# Patient Record
Sex: Female | Born: 1995 | Race: White | Hispanic: No | Smoking: Current every day smoker
Health system: Southern US, Community
[De-identification: ages and names within clinical notes are randomized; demographics above are authoritative.]

## PROBLEM LIST (undated history)

## (undated) ENCOUNTER — Inpatient Hospital Stay: Payer: Self-pay

## (undated) DIAGNOSIS — Z789 Other specified health status: Secondary | ICD-10-CM

## (undated) DIAGNOSIS — Z9119 Patient's noncompliance with other medical treatment and regimen: Secondary | ICD-10-CM

## (undated) DIAGNOSIS — O2340 Unspecified infection of urinary tract in pregnancy, unspecified trimester: Secondary | ICD-10-CM

## (undated) DIAGNOSIS — K5900 Constipation, unspecified: Secondary | ICD-10-CM

## (undated) DIAGNOSIS — F1290 Cannabis use, unspecified, uncomplicated: Secondary | ICD-10-CM

## (undated) DIAGNOSIS — Z91199 Patient's noncompliance with other medical treatment and regimen due to unspecified reason: Secondary | ICD-10-CM

## (undated) DIAGNOSIS — O99619 Diseases of the digestive system complicating pregnancy, unspecified trimester: Secondary | ICD-10-CM

## (undated) DIAGNOSIS — D649 Anemia, unspecified: Secondary | ICD-10-CM

## (undated) DIAGNOSIS — O0930 Supervision of pregnancy with insufficient antenatal care, unspecified trimester: Secondary | ICD-10-CM

## (undated) DIAGNOSIS — F1910 Other psychoactive substance abuse, uncomplicated: Secondary | ICD-10-CM

## (undated) HISTORY — PX: NO PAST SURGERIES: SHX2092

---
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
## 2017-04-28 ENCOUNTER — Inpatient Hospital Stay
Admission: EM | Admit: 2017-04-28 | Discharge: 2017-04-28 | Disposition: A | Discharge status: Home / Self Care | Payer: Medicaid Other | Attending: Obstetrics and Gynecology | Admitting: Obstetrics and Gynecology

## 2017-04-28 ENCOUNTER — Encounter: Payer: Self-pay | Admitting: Obstetrics and Gynecology

## 2017-04-28 DIAGNOSIS — O26899 Other specified pregnancy related conditions, unspecified trimester: Secondary | ICD-10-CM

## 2017-04-28 DIAGNOSIS — R109 Unspecified abdominal pain: Principal | ICD-10-CM

## 2017-04-28 MED ORDER — CALCIUM CARBONATE ANTACID 500 MG PO CHEW: 2.0000 | CHEWABLE_TABLET | ORAL | Status: DC | PRN

## 2017-04-28 MED ORDER — ZOLPIDEM TARTRATE 5 MG PO TABS: 5.0000 mg | ORAL_TABLET | Freq: Every evening | ORAL | Status: DC | PRN

## 2017-04-28 MED ORDER — ACETAMINOPHEN 325 MG PO TABS: 650.0000 mg | ORAL_TABLET | ORAL | Status: DC | PRN

## 2017-04-28 MED ORDER — PRENATAL MULTIVITAMIN CH: 1.0000 | ORAL_TABLET | Freq: Every day | ORAL | Status: DC

## 2017-04-28 MED ORDER — DOCUSATE SODIUM 100 MG PO CAPS: 100.0000 mg | ORAL_CAPSULE | Freq: Every day | ORAL | Status: DC

## 2017-04-28 NOTE — OB Triage Provider Note (Signed)
TRIAGE VISIT with NST   Tamara [name] is a 21 y.o. G1P0. She is at Unknown gestation, but by LMP of approximately 09/17/16 she is at 8335w6d.  Indication: abdominal pain.   S: No CTX, no VB. Active fetal movement. No nausea, vomiting, fever. She entered care at 24wks and has had 1 prenatal visit.  O:  Ht 4\' 11"  (1.499 m)   Wt 48.5 kg (107 lb)   BMI 21.61 kg/m  No results found for this or any previous visit (from the past 48 hour(s)).   Gen: NAD, AAOx3      Abd: FNTTP      Ext: Non-tender, Nonedmeatous    FHT: 140, mod var, +accels, decels TOCO: quiet SVE:  deferred   A/P:  21 y.o. G1P0 at approx 8535w6d with abdominal pain   Limited prenatal care  - will draw prenatal labs, get u/s, check for liver, urinary tract infection, monitoring, UDS, ultrasound and will check cervix  Fetal Wellbeing: NST is Reassuring Cat 1 tracing.

## 2017-04-28 NOTE — Progress Notes (Signed)
Went into patient's room to readjust ultrasound monitor. Patient was not in the room or the bathroom and the gown and monitors were left on the bed. Patient appear to have left AMA. Patient did not let anyone know that she was leaving. Dr. Dalbert GarnetBeasley, MD notified and aware.

## 2017-04-28 NOTE — Progress Notes (Signed)
  Discharge summary  Pt left AMA without alerting any of the staff. She did not receive an ultrasound or labs.

## 2017-04-28 NOTE — OB Triage Note (Signed)
Pt presents to [facility] with intermittent sharp, lower abdominal pain and tightness that began last night. Pt denies sharp pain at this time, but states that her lower abdomen is tight and rates her pain 7/10. Pt reports positive fetal movement. Denies vaginal bleeding and LOF. VSS. Monitors applied and assessing.

## 2017-06-28 ENCOUNTER — Other Ambulatory Visit: Payer: Self-pay

## 2017-06-28 ENCOUNTER — Inpatient Hospital Stay
Admission: EM | Admit: 2017-06-28 | Discharge: 2017-06-29 | DRG: 833 | Disposition: A | Discharge status: Home / Self Care | Payer: Medicaid Other | Attending: Obstetrics and Gynecology | Admitting: Obstetrics and Gynecology

## 2017-06-28 ENCOUNTER — Inpatient Hospital Stay: Payer: Medicaid Other

## 2017-06-28 DIAGNOSIS — O99013 Anemia complicating pregnancy, third trimester: Secondary | ICD-10-CM | POA: Diagnosis present

## 2017-06-28 DIAGNOSIS — F1290 Cannabis use, unspecified, uncomplicated: Secondary | ICD-10-CM | POA: Diagnosis present

## 2017-06-28 DIAGNOSIS — O99323 Drug use complicating pregnancy, third trimester: Secondary | ICD-10-CM | POA: Diagnosis present

## 2017-06-28 DIAGNOSIS — O99613 Diseases of the digestive system complicating pregnancy, third trimester: Secondary | ICD-10-CM | POA: Diagnosis present

## 2017-06-28 DIAGNOSIS — R1030 Lower abdominal pain, unspecified: Secondary | ICD-10-CM | POA: Diagnosis present

## 2017-06-28 DIAGNOSIS — Z3A40 40 weeks gestation of pregnancy: Secondary | ICD-10-CM | POA: Diagnosis not present

## 2017-06-28 DIAGNOSIS — D649 Anemia, unspecified: Secondary | ICD-10-CM | POA: Diagnosis present

## 2017-06-28 DIAGNOSIS — F17210 Nicotine dependence, cigarettes, uncomplicated: Secondary | ICD-10-CM | POA: Diagnosis present

## 2017-06-28 DIAGNOSIS — O0933 Supervision of pregnancy with insufficient antenatal care, third trimester: Secondary | ICD-10-CM

## 2017-06-28 DIAGNOSIS — K5909 Other constipation: Secondary | ICD-10-CM | POA: Diagnosis present

## 2017-06-28 DIAGNOSIS — O99333 Smoking (tobacco) complicating pregnancy, third trimester: Secondary | ICD-10-CM | POA: Diagnosis present

## 2017-06-28 HISTORY — DX: Other specified health status: Z78.9

## 2017-06-28 HISTORY — DX: Supervision of pregnancy with insufficient antenatal care, third trimester: O09.33

## 2017-06-28 MED ORDER — ONDANSETRON HCL 4 MG/2ML IJ SOLN: 4.0000 mg | Freq: Four times a day (QID) | INTRAMUSCULAR | Status: DC | PRN

## 2017-06-28 MED ORDER — ACETAMINOPHEN 325 MG PO TABS: 650.0000 mg | ORAL_TABLET | ORAL | Status: DC | PRN

## 2017-06-28 MED ORDER — OXYTOCIN 40 UNITS IN LACTATED RINGERS INFUSION - SIMPLE MED: 2.5000 [IU]/h | INTRAVENOUS | Status: DC

## 2017-06-28 MED ORDER — SOD CITRATE-CITRIC ACID 500-334 MG/5ML PO SOLN: 30.0000 mL | ORAL | Status: DC | PRN

## 2017-06-28 MED ORDER — LIDOCAINE HCL (PF) 1 % IJ SOLN: 30.0000 mL | INTRAMUSCULAR | Status: DC | PRN

## 2017-06-28 MED ORDER — OXYTOCIN BOLUS FROM INFUSION: 500.0000 mL | Freq: Once | INTRAVENOUS | Status: DC

## 2017-06-28 MED ORDER — LACTATED RINGERS IV SOLN: 500.0000 mL | INTRAVENOUS | Status: DC | PRN

## 2017-06-28 MED ORDER — LACTATED RINGERS IV SOLN
INTRAVENOUS | Status: DC
  Administered 2017-06-29 (×2): via INTRAVENOUS

## 2017-06-28 NOTE — OB Triage Note (Signed)
Pt arrived in triage with c/o of lower abdominal "cramping" since yesterday rating pain 7/10.  Denies leaking of fluid or vaginal bleeding. Reports good fetal movement. Fetal movement palpated and audible on monitor.  EFM explained and applied. Discussed plan of care. Pt verbalized understanding.

## 2017-06-28 NOTE — Progress Notes (Signed)
Tamara Tamara [name] is Tamara [name] 22 y.o. G1P0 female. She is at 5245w4d gestation. No LMP recorded. Patient is pregnant. Estimated Date of Delivery: 06/24/17  Prenatal care site:  [facility]   Current pregnancy complicated by:  1. Limited/late prenatal care, 1 visit to [facility] at 24wks per LMP. 2. Current tobacco smoker, 1ppd 3. Hx illicit drug use, currently only MJ.   Chief complaint: abdominal pain   Location: lower abdomen Duration: cramping since yesterday.  Quality: comes and goes Severity: 7/10 Aggravating or alleviating conditions: Had red vaginal bleeding in last 6 weeks, thick "looked like ketchup"  Associated signs/symptoms: denies LOF, feeling irregular cramping.  Context:  S: Resting comfortably. no CTX, no VB.no LOF,  Active fetal movement. Denies: HA, visual changes, SOB, or RUQ/epigastric pain.   Maternal Medical History:   Past Medical History:  Diagnosis Date  . Medical history non-contributory   Pt denies medical or surgical hx.   Past Surgical History:  Procedure Laterality Date  . NO PAST SURGERIES      Not on File  Prior to Admission medications   Medication Sig Start Date End Date Taking? Authorizing Provider  Prenatal Vit-Fe Fumarate-FA (PRENATAL MULTIVITAMIN) TABS tablet Take 1 tablet by mouth daily at 12 noon.   Yes [provider]      Social History: She  reports that she has been smoking cigarettes.  She has Tamara [name] 5.00 pack-year smoking history. she has never used smokeless tobacco. She reports that she uses drugs. Drug: Marijuana. She reports that she does not drink alcohol. Social hx of cocaine, MJ, ETOH, acid and xanax at age 22 per [facility] records.   Family History: family history is not on file.  Review of Systems: Tamara [name] full review of systems was performed and negative except as noted in the HPI.     O:  BP 114/68   Pulse (!) 101   Temp 98 F (36.7 C) (Oral)   Resp 18   Ht 4\' 11"  (1.499 m)   Wt 132 lb (59.9 kg)   BMI 26.66 kg/m  No results found  for this or any previous visit (from the past 48 hour(s)).   Constitutional: NAD, AAOx3  HE/ENT: extraocular movements grossly intact, moist mucous membranes CV: RRR PULM: nl respiratory effort, CTABL     Abd: gravid, non-tender, non-distended, soft      Ext: Non-tender, Nonedematous   Psych: mood appropriate, speech normal Pelvic: deferred until after US regarding placental location.   Fetal  monitoring: Cat 1 Appropriate for GA Baseline:  Variability: moderate Accelerations: present  Decelerations absent    Tamara [name]/P: 22 y.o. 1445w4d here for triage visit for abdominal pain in pregnancy   Fetal Wellbeing: Reassuring Cat 1 tracing.  No prenatal care and 11045w4d by LMP- will obtain US and prenatal labs now.   Discussed with Dr Feliberto GottronSchermerhorn regarding POC. Plan to admit.    Tamara Tamara [name], CNM 06/28/17

## 2017-06-28 NOTE — H&P (Signed)
OB History & Physical   History of Present Illness:  Chief Complaint: presented with C/o abdominal pain.   HPI:  Tamara [name] is a 22 y.o. G1P0 female at 4672w4d dated by approximate LMP.   Active FM onset of ctx 06/27/17, currently every 3-6 minutes with uterine irritability Denies LOF, recent VB (last approx 6wks ago). Pelvic pressure intermittently.     Pregnancy Issues: 1. Limited/late prenatal care, 1 visit to [facility] at 24wks per LMP. 2. Current tobacco smoker, 1ppd 3. Hx illicit drug use, currently only MJ.    Maternal Medical History:   Past Medical History:  Diagnosis Date  . Medical history non-contributory   Pt reports chronic constipation, "never been able to have normal BM"   Past Surgical History:  Procedure Laterality Date  . NO PAST SURGERIES      Not on File  Prior to Admission medications   Medication Sig Start Date End Date Taking? Authorizing Provider  Prenatal Vit-Fe Fumarate-FA (PRENATAL MULTIVITAMIN) TABS tablet Take 1 tablet by mouth daily at 12 noon.   Yes [provider]     [facility] (only one visit, approx 24wks)  Social History: She  reports that she has been smoking cigarettes.  She has a 5.00 pack-year smoking history. she has never used smokeless tobacco. She reports that she uses drugs. Drug: Marijuana. She reports that she does not drink alcohol. Social hx of cocaine, MJ, ETOH, acid and xanax at age 22 per [facility] records.    Family History: family history is not on file.   Review of Systems: A full review of systems was performed and negative except as noted in the HPI.     Physical Exam:  Vital Signs: BP 114/68   Pulse (!) 101   Temp 98 F (36.7 C) (Oral)   Resp 18   Ht 4\' 11"  (1.499 m)   Wt 132 lb (59.9 kg)   BMI 26.66 kg/m  General: no acute distress.  HEENT: normocephalic, atraumatic Heart: regular rate & rhythm.  No murmurs/rubs/gallops Lungs: clear to auscultation  bilaterally, normal respiratory effort Abdomen: soft, gravid, non-tender;  EFW: 8lbs by leopolds Pelvic:   External: Normal external female genitalia with significant erythema, white vaginal discharge.  Unable to perform speculum or vaginal exam without significant pt discomfort, extremely large stool burden noted in rectum.    Cervix:   /   /   unable to assess, closed per US images.    Extremities: non-tender, symmetric, no edema bilaterally.  DTRs: 2+  Neurologic: Alert & oriented x 3.    Results for orders placed or performed during the hospital encounter of 06/28/17 (from the past 24 hour(s))  CBC     Status: Abnormal   Collection Time: 06/28/17 11:39 PM  Result Value Ref Range   WBC 11.8 (H) 3.6 - 11.0 K/uL   RBC 3.34 (L) 3.80 - 5.20 MIL/uL   Hemoglobin 8.5 (L) 12.0 - 16.0 g/dL   HCT 11.925.9 (L) 14.735.0 - 82.947.0 %   MCV 77.5 (L) 80.0 - 100.0 fL   MCH 25.5 (L) 26.0 - 34.0 pg   MCHC 32.9 32.0 - 36.0 g/dL   RDW 56.215.7 (H) 13.011.5 - 86.514.5 %   Platelets 424 150 - 440 K/uL  Type and screen [facility]     Status: None (Preliminary result)   Collection Time: 06/28/17 11:39 PM  Result Value Ref Range   ABO/RH(D) PENDING    Antibody Screen  PENDING    Sample Expiration      07/01/2017 Performed at [facility], [address] [place], [place] [address]   Comprehensive metabolic panel     Status: Abnormal   Collection Time: 06/28/17 11:39 PM  Result Value Ref Range   Sodium 134 (L) 135 - 145 mmol/L   Potassium 3.6 3.5 - 5.1 mmol/L   Chloride 104 101 - 111 mmol/L   CO2 19 (L) 22 - 32 mmol/L   Glucose, Bld 93 65 - 99 mg/dL   BUN 10 6 - 20 mg/dL   Creatinine, Ser 8.11 0.44 - 1.00 mg/dL   Calcium 8.8 (L) 8.9 - 10.3 mg/dL   Total Protein 6.7 6.5 - 8.1 g/dL   Albumin 2.8 (L) 3.5 - 5.0 g/dL   AST 17 15 - 41 U/L   ALT 11 (L) 14 - 54 U/L   Alkaline Phosphatase 140 (H) 38 - 126 U/L   Total Bilirubin 0.7 0.3 - 1.2 mg/dL   GFR calc non Af Amer >60 >60 mL/min   GFR  calc Af Amer >60 >60 mL/min   Anion gap 11 5 - 15  Rapid HIV screen (HIV 1/2 Ab+Ag) ([facility] Only)     Status: None   Collection Time: 06/28/17 11:39 PM  Result Value Ref Range   HIV-1 P24 Antigen - HIV24 NON REACTIVE NON REACTIVE   HIV 1/2 Antibodies NON REACTIVE NON REACTIVE   Interpretation (HIV Ag Ab)      A non reactive test result means that HIV 1 or HIV 2 antibodies and HIV 1 p24 antigen were not detected in the specimen.    Pertinent Results:  Prenatal Labs: No prenatal labs available- ordered and drawn, swabs sent for GC/Ct and GBS  FHT: 125bpm, moderate variability, + accels, no decels TOCO: 3-38min, palpate mild SVE:    /   /      Cephalic by leopolds  US Ob Limited  Result Date: 06/29/2017 CLINICAL DATA:  Third trimester pregnancy with no prenatal care. Lower abdominal cramping. EXAM: LIMITED OBSTETRIC ULTRASOUND FINDINGS: Number of Fetuses: 1 Heart Rate:  141 bpm Movement: Yes Presentation: Transverse, head at maternal right. Placental Location: Posterolateral. Previa: Unable to evaluate. The inferior tip of the placenta could not be visualized. Amniotic Fluid (Subjective): Within normal limits. AFI equals 10.7 cm, between the fifth and fiftieth percentile. BPD:  9.3 cmcm 37w 6d MATERNAL FINDINGS: Cervix:  Appears closed. Ovaries: Not visualized. IMPRESSION: Single live intrauterine pregnancy corresponding to 37 weeks and 6 days gestation. The inferior tip of the placenta could not be visualized, and therefore the evaluation for placenta previa is incomplete. Otherwise no apparent complications. This exam is performed on an emergent basis and does not comprehensively evaluate fetal size, dating, or anatomy; follow-up complete OB US should be considered if further fetal assessment is warranted. Electronically Signed   By: Ted Mcalpine M.D.   On: 06/29/2017 00:07    Assessment:  Tamara [name] is a 22 y.o. G1P0 female at [redacted]w[redacted]d with no prenatal care, hx substance abuse, and  tobacco abuse.   Plan:  1. Admit to Antepartum status, consents reviewed and obtained - Plan to observe overnight for signs of active labor - SW consult placed - Formal US done with inconclusive placental margin and unable to rule out previa.    2. Fetal Well being  - Fetal Tracing: Cat I  - Group B Streptococcus ppx indicated: term, low risk, afebrile - Presentation: vertex confirmed by [place]  3. Routine OB: - Prenatal labs ordered, including urine drug screen - CBC, T&S, RPR on admit - Clear fluids, IVF  4. Monitoring -  Contractions: Irregular, with uterine irritability now.  external toco in place -  Pelvis unproven -  Plan for NST in am, doppler FHR q4hrs with VS -  Maternal pain control if needed.   5. Anemia- Hgb 8.5, asymptomatic  6. Constipation-  Fleets enema now, miralax in AM Clear liquid diet  Titianna Loomis A, CNM 06/29/17 12:53 AM

## 2017-06-29 LAB — COMPREHENSIVE METABOLIC PANEL
ALK PHOS: 140 U/L — AB (ref 38–126)
ALT: 11 U/L — AB (ref 14–54)
AST: 17 U/L (ref 15–41)
Albumin: 2.8 g/dL — ABNORMAL LOW (ref 3.5–5.0)
Anion gap: 11 (ref 5–15)
BUN: 10 mg/dL (ref 6–20)
CALCIUM: 8.8 mg/dL — AB (ref 8.9–10.3)
CHLORIDE: 104 mmol/L (ref 101–111)
CO2: 19 mmol/L — AB (ref 22–32)
CREATININE: 0.55 mg/dL (ref 0.44–1.00)
GFR calc Af Amer: >60 mL/min (ref >60)
GFR calc non Af Amer: >60 mL/min (ref >60)
Glucose, Bld: 93 mg/dL (ref 65–99)
Potassium: 3.6 mmol/L (ref 3.5–5.1)
SODIUM: 134 mmol/L — AB (ref 135–145)
Total Bilirubin: 0.7 mg/dL (ref 0.3–1.2)
Total Protein: 6.7 g/dL (ref 6.5–8.1)

## 2017-06-29 LAB — HEPATITIS B SURFACE ANTIGEN: Hepatitis B Surface Ag: NEGATIVE

## 2017-06-29 LAB — WET PREP, GENITAL
SPERM: NONE SEEN
Yeast Wet Prep HPF POC: NONE SEEN

## 2017-06-29 LAB — RAPID HIV SCREEN (HIV 1/2 AB+AG)
HIV 1/2 Antibodies: NONREACTIVE
HIV-1 P24 Antigen - HIV24: NONREACTIVE

## 2017-06-29 LAB — URINE DRUG SCREEN, QUALITATIVE (ARMC ONLY)
Amphetamines, Ur Screen: NOT DETECTED
BARBITURATES, UR SCREEN: NOT DETECTED
BENZODIAZEPINE, UR SCRN: NOT DETECTED
COCAINE METABOLITE, UR ~~LOC~~: NOT DETECTED
Cannabinoid 50 Ng, Ur ~~LOC~~: POSITIVE — AB
MDMA (Ecstasy)Ur Screen: NOT DETECTED
METHADONE SCREEN, URINE: NOT DETECTED
OPIATE, UR SCREEN: NOT DETECTED
PHENCYCLIDINE (PCP) UR S: NOT DETECTED
Tricyclic, Ur Screen: NOT DETECTED

## 2017-06-29 LAB — CBC
HEMATOCRIT: 25.9 % — AB (ref 35.0–47.0)
Hemoglobin: 8.5 g/dL — ABNORMAL LOW (ref 12.0–16.0)
MCH: 25.5 pg — AB (ref 26.0–34.0)
MCHC: 32.9 g/dL (ref 32.0–36.0)
MCV: 77.5 fL — AB (ref 80.0–100.0)
PLATELETS: 424 10*3/uL (ref 150–440)
RBC: 3.34 MIL/uL — ABNORMAL LOW (ref 3.80–5.20)
RDW: 15.7 % — ABNORMAL HIGH (ref 11.5–14.5)
WBC: 11.8 10*3/uL — AB (ref 3.6–11.0)

## 2017-06-29 LAB — CHLAMYDIA/NGC RT PCR (ARMC ONLY)
Chlamydia Tr: NOT DETECTED
N GONORRHOEAE: NOT DETECTED

## 2017-06-29 LAB — TYPE AND SCREEN
ABO/RH(D): A NEG
Antibody Screen: NEGATIVE

## 2017-06-29 LAB — FETAL SCREEN: Fetal Screen: NEGATIVE

## 2017-06-29 MED ORDER — POLYETHYLENE GLYCOL 3350 17 G PO PACK
17.0000 g | PACK | Freq: Every day | ORAL | Status: DC
  Administered 2017-06-29: 17 g via ORAL
  Filled 2017-06-29: qty 1

## 2017-06-29 MED ORDER — FLEET ENEMA 7-19 GM/118ML RE ENEM
1.0000 | ENEMA | Freq: Once | RECTAL | Status: AC
  Administered 2017-06-29: 1 via RECTAL

## 2017-06-29 MED ORDER — PRENATAL MULTIVITAMIN CH
1.0000 | ORAL_TABLET | Freq: Every day | ORAL | Status: DC
  Administered 2017-06-29: 1 via ORAL
  Filled 2017-06-29: qty 1

## 2017-06-29 MED ORDER — METRONIDAZOLE 500 MG PO TABS
500.0000 mg | ORAL_TABLET | Freq: Two times a day (BID) | ORAL | Status: DC
  Administered 2017-06-29 (×2): 500 mg via ORAL
  Filled 2017-06-29 (×3): qty 1

## 2017-06-29 MED ORDER — FERROUS SULFATE 325 (65 FE) MG PO TBEC: 325.0000 mg | DELAYED_RELEASE_TABLET | Freq: Two times a day (BID) | ORAL | 3 refills | Status: DC

## 2017-06-29 MED ORDER — RHO D IMMUNE GLOBULIN 1500 UNIT/2ML IJ SOSY
300.0000 ug | PREFILLED_SYRINGE | Freq: Once | INTRAMUSCULAR | Status: AC
  Administered 2017-06-29: 300 ug via INTRAVENOUS
  Filled 2017-06-29: qty 2

## 2017-06-29 MED ORDER — FLEET ENEMA 7-19 GM/118ML RE ENEM: 1.0000 | ENEMA | Freq: Two times a day (BID) | RECTAL | Status: DC | PRN

## 2017-06-29 NOTE — Progress Notes (Addendum)
Labor Progress Note  Tamara ChurnBriannie L Fleece is a 22 y.o. G1P0 at 7877w5d by LMP with US dating at 9463w0d admitted for abdominal pain in pregnancy with no prenatal care, hx substance and tobacco use.   Subjective: mild lower abdominal cramping. + FM  Objective: BP (!) 96/51 (BP Location: Left Arm)   Pulse 75   Temp 97.7 F (36.5 C) (Oral)   Resp 16   Ht 4\' 11"  (1.499 m)   Wt 132 lb (59.9 kg)   BMI 26.66 kg/m  Notable VS details: reviewed  Fetal Assessment: FHT:  FHR: 125 bpm, variability: moderate,  accelerations:  Present,  decelerations:  Absent Category/reactivity:  Category I UC:   irregular, with  Uterine irritability  Membrane status:intact  Labs: Lab Results  Component Value Date   WBC 11.8 (H) 06/28/2017   HGB 8.5 (L) 06/28/2017   HCT 25.9 (L) 06/28/2017   MCV 77.5 (L) 06/28/2017   PLT 424 06/28/2017    Assessment / Plan: 1. Unsure dating of pregnancy d/t no prenatal care.   2377w5d by LMP, 1928w6d by US on 06/28/17; unable to determine placental edge via US.   2. Severe constipation: Fleet enema q12hrs, start daily miralax  3.  prenatal labs- A Neg with neg Ab screen- rhogam now  Anemia- Hgb 8.5; consider iron infusion vs PO iron.   4. Hx substance abuse, tobacco use: MJ + on UDS, plan SW consult prior to DC.   5. Wet prep: + trich, +clue- Flagyl 500mg  PO BID  Labor: not in labor Preeclampsia:  no signs or symptoms of toxicity Fetal Wellbeing:  Category I Pain Control:  no pain medsrequested I/D:  Flagyl 500mg  PO BID    Tulip Meharg A, CNM 06/29/2017, 2:49 AM

## 2017-06-29 NOTE — Progress Notes (Signed)
Patient reports this afternoon chronic constipation since childhood which has caused "organs to be in wrong places". Pt states she is hungry and wants to leave.   Milon Scorearon Jones CNM notified patient is finished pooping since soap suds enema earlier this afternoon.

## 2017-06-29 NOTE — Progress Notes (Signed)
CNM paged and notified of patient waiting for discharge orders.

## 2017-06-29 NOTE — Discharge Instructions (Signed)

## 2017-06-29 NOTE — Progress Notes (Signed)
pt discharged home with family.  Discharge instructions, prescriptions and follow up appointment given to and reviewed with pt.  Pt verbalized understanding, all questions answered.  Escorted by auxiliary. 

## 2017-06-29 NOTE — Progress Notes (Addendum)
Post  Admission  Day 1 Subjective: I feel better after the enema  Objective: Blood pressure (!) 96/44, pulse 73, temperature 98.6 F (37 C), resp. rate 16, height 4\' 11"  (1.499 m), weight 59.9 kg (132 lb), SpO2 99 %.  Physical Exam:  General: A,A&O x3 Lochia:none Uterus:Gravid Incision: None DVT Evaluation: Neg Homans   Recent Labs    06/28/17 2339  HGB 8.5*  HCT 25.9*    Assessment/Plan: A:1. IUP at 40 5/7 weeks 2. Severe constipation 3. Lower abd pain due to Constipation P:1. SSE  2. Will plan for IOL after results from SSE 3. FKC's 4. Pt needs fu scheduled Sharee Pimplearon W. Jones, RN, MSN, CNM, FNP   LOS: 1 day   Sharee Pimplearon W Jones 06/29/2017, 12:30 PM

## 2017-06-29 NOTE — Discharge Summary (Signed)
Obstetric Discharge Summary   Patient ID: Patient Name: Tamara [name] DOB: 1996-02-21 MRN: [id number]  Date of Admission: 06/28/2017 Date of Delivery: IOL scheduled for 07/02/17 at 0001 Delivered by:N/A Date of Discharge: 06/29/2017  Primary OB:  [facility] x 2 visits, has not been seen since Sept 2018 LMP:No LMP recorded. Patient is pregnant. EDC Estimated Date of Delivery: 06/24/17 Gestational Age at Delivery: 4877w5d   Antepartum complications: poor PNC, +Marijuana hx, chronic constipation hx  Admitting Diagnosis: IUP at 40 5/7 weeks with chronic constipation and lower abd pain   Secondary Diagnoses: Patient Active Problem List   Diagnosis Date Noted  . No prenatal care in current pregnancy in third trimester 06/28/2017    Augmentation: None Complications:2 enemas with normal results and felt improved Intrapartum complications/course: N/A Delivery Type:N/A Anesthesia: N/A Placenta:N/A Laceration: N/A Episiotomy: none  Newborn Data: This patient has no babies on file.     Postpartum Course  N/A  Labs: CBC Latest Ref Rng & Units 06/28/2017  WBC 3.6 - 11.0 K/uL 11.8(H)  Hemoglobin 12.0 - 16.0 g/dL 4.6(N8.5(L)  Hematocrit 62.935.0 - 47.0 % 25.9(L)  Platelets 150 - 440 K/uL 424   A NEG  Physical exam:  BP 126/65 (BP Location: Left Arm)   Pulse 85   Temp 98.4 F (36.9 C) (Oral)   Resp 16   Ht 4\' 11"  (1.499 m)   Wt 59.9 kg (132 lb)   SpO2 100%   BMI 26.66 kg/m  General: alert and no distress Pulm: normal respiratory effort Abdomen: Gravid  Extremities: No evidence of DVT seen on physical exam. No lower extremity edema.  Vag exam was attemtped with stool coming  in the vaginal region and through the post vag wall so it could be easily palpated and unable to reach the cx due to the massive stool in the rectum and colon Disposition: stable, discharge to home Baby Feeding: N/A Baby Disposition: N/A  Contraception: undecided   Prenatal Labs: A neg,  H&H: 8.5/25.9,  +marijuana  Labs ordered on admission but, not available yet  Plan:  Tamara [name] was discharged to home in good condition. FU here for IOL on 07/02/17 at 0001  Discharge Instructions: Per After Visit Summary. Activity: Advance as tolerated. Pelvic rest for 6 weeks.   Diet: Regular Discharge Medications:PNV, Metamucil,   Outpatient follow up: 2 days for IOL   Signed:  Sharee Pimplearon W Jolyssa Oplinger

## 2017-06-30 LAB — RHOGAM INJECTION: Unit division: 0

## 2017-06-30 LAB — RPR: RPR: NONREACTIVE

## 2017-06-30 LAB — VARICELLA ZOSTER ANTIBODY, IGG: Varicella IgG: 918 index (ref >165)

## 2017-06-30 LAB — RUBELLA SCREEN: Rubella: 1.65 index (ref >0.99)

## 2017-07-01 ENCOUNTER — Inpatient Hospital Stay
Admission: RE | Admit: 2017-07-01 | Discharge: 2017-07-05 | DRG: 786 | Disposition: A | Discharge status: Home / Self Care | Payer: Medicaid Other | Source: Intra-hospital | Attending: Obstetrics and Gynecology | Admitting: Obstetrics and Gynecology

## 2017-07-01 DIAGNOSIS — K5641 Fecal impaction: Secondary | ICD-10-CM | POA: Diagnosis present

## 2017-07-01 DIAGNOSIS — Z9889 Other specified postprocedural states: Secondary | ICD-10-CM

## 2017-07-01 DIAGNOSIS — Z3A41 41 weeks gestation of pregnancy: Secondary | ICD-10-CM

## 2017-07-01 DIAGNOSIS — O9832 Other infections with a predominantly sexual mode of transmission complicating childbirth: Secondary | ICD-10-CM | POA: Diagnosis present

## 2017-07-01 DIAGNOSIS — O99334 Smoking (tobacco) complicating childbirth: Secondary | ICD-10-CM | POA: Diagnosis present

## 2017-07-01 DIAGNOSIS — O9989 Other specified diseases and conditions complicating pregnancy, childbirth and the puerperium: Secondary | ICD-10-CM | POA: Diagnosis present

## 2017-07-01 DIAGNOSIS — O480 Post-term pregnancy: Secondary | ICD-10-CM | POA: Diagnosis present

## 2017-07-01 DIAGNOSIS — B9689 Other specified bacterial agents as the cause of diseases classified elsewhere: Secondary | ICD-10-CM | POA: Diagnosis present

## 2017-07-01 DIAGNOSIS — O9902 Anemia complicating childbirth: Secondary | ICD-10-CM | POA: Diagnosis present

## 2017-07-01 DIAGNOSIS — F17210 Nicotine dependence, cigarettes, uncomplicated: Secondary | ICD-10-CM | POA: Diagnosis present

## 2017-07-01 DIAGNOSIS — D649 Anemia, unspecified: Secondary | ICD-10-CM | POA: Diagnosis present

## 2017-07-01 DIAGNOSIS — F1290 Cannabis use, unspecified, uncomplicated: Secondary | ICD-10-CM | POA: Diagnosis present

## 2017-07-01 DIAGNOSIS — A5901 Trichomonal vulvovaginitis: Secondary | ICD-10-CM | POA: Diagnosis present

## 2017-07-01 DIAGNOSIS — O99324 Drug use complicating childbirth: Secondary | ICD-10-CM | POA: Diagnosis present

## 2017-07-01 DIAGNOSIS — K5900 Constipation, unspecified: Secondary | ICD-10-CM

## 2017-07-01 DIAGNOSIS — Z8759 Personal history of other complications of pregnancy, childbirth and the puerperium: Secondary | ICD-10-CM

## 2017-07-01 HISTORY — DX: Patient's noncompliance with other medical treatment and regimen: Z91.19

## 2017-07-01 HISTORY — DX: Anemia, unspecified: D64.9

## 2017-07-01 HISTORY — DX: Unspecified infection of urinary tract in pregnancy, unspecified trimester: O23.40

## 2017-07-01 HISTORY — DX: Other psychoactive substance abuse, uncomplicated: F19.10

## 2017-07-01 HISTORY — DX: Cannabis use, unspecified, uncomplicated: F12.90

## 2017-07-01 HISTORY — DX: Diseases of the digestive system complicating pregnancy, unspecified trimester: O99.619

## 2017-07-01 HISTORY — DX: Constipation, unspecified: K59.00

## 2017-07-01 HISTORY — DX: Patient's noncompliance with other medical treatment and regimen due to unspecified reason: Z91.199

## 2017-07-01 HISTORY — DX: Supervision of pregnancy with insufficient antenatal care, unspecified trimester: O09.30

## 2017-07-01 LAB — CULTURE, BETA STREP (GROUP B ONLY)

## 2017-07-01 LAB — CBC
HCT: 25 % — ABNORMAL LOW (ref 35.0–47.0)
HEMOGLOBIN: 8.5 g/dL — AB (ref 12.0–16.0)
MCH: 26.1 pg (ref 26.0–34.0)
MCHC: 33.9 g/dL (ref 32.0–36.0)
MCV: 77 fL — ABNORMAL LOW (ref 80.0–100.0)
PLATELETS: 439 10*3/uL (ref 150–440)
RBC: 3.25 MIL/uL — AB (ref 3.80–5.20)
RDW: 16.2 % — ABNORMAL HIGH (ref 11.5–14.5)
WBC: 10.7 10*3/uL (ref 3.6–11.0)

## 2017-07-01 MED ORDER — LIDOCAINE HCL (PF) 1 % IJ SOLN: 30.0000 mL | INTRAMUSCULAR | Status: DC | PRN

## 2017-07-01 MED ORDER — ACETAMINOPHEN 325 MG PO TABS: 650.0000 mg | ORAL_TABLET | ORAL | Status: DC | PRN

## 2017-07-01 MED ORDER — BUTORPHANOL TARTRATE 1 MG/ML IJ SOLN: 1.0000 mg | INTRAMUSCULAR | Status: DC | PRN

## 2017-07-01 MED ORDER — TERBUTALINE SULFATE 1 MG/ML IJ SOLN: 0.2500 mg | Freq: Once | INTRAMUSCULAR | Status: DC | PRN

## 2017-07-01 MED ORDER — LACTATED RINGERS IV SOLN: 500.0000 mL | INTRAVENOUS | Status: DC | PRN

## 2017-07-01 MED ORDER — OXYTOCIN BOLUS FROM INFUSION: 500.0000 mL | Freq: Once | INTRAVENOUS | Status: DC

## 2017-07-01 MED ORDER — MISOPROSTOL 25 MCG QUARTER TABLET
25.0000 ug | ORAL_TABLET | ORAL | Status: DC | PRN
  Filled 2017-07-01: qty 1

## 2017-07-01 MED ORDER — LACTATED RINGERS IV SOLN
INTRAVENOUS | Status: DC
  Administered 2017-07-01 – 2017-07-02 (×3): via INTRAVENOUS

## 2017-07-01 MED ORDER — MISOPROSTOL 25 MCG QUARTER TABLET
25.0000 ug | ORAL_TABLET | ORAL | Status: DC | PRN
  Administered 2017-07-02 (×2): 25 ug via BUCCAL
  Filled 2017-07-01 (×2): qty 1

## 2017-07-01 MED ORDER — SOD CITRATE-CITRIC ACID 500-334 MG/5ML PO SOLN
30.0000 mL | ORAL | Status: DC | PRN
  Administered 2017-07-02: 30 mL via ORAL
  Filled 2017-07-01: qty 15

## 2017-07-01 MED ORDER — OXYCODONE-ACETAMINOPHEN 5-325 MG PO TABS: 1.0000 | ORAL_TABLET | ORAL | Status: DC | PRN

## 2017-07-01 MED ORDER — ONDANSETRON HCL 4 MG/2ML IJ SOLN
4.0000 mg | Freq: Four times a day (QID) | INTRAMUSCULAR | Status: DC | PRN
  Administered 2017-07-02: 4 mg via INTRAVENOUS

## 2017-07-01 MED ORDER — OXYTOCIN 40 UNITS IN LACTATED RINGERS INFUSION - SIMPLE MED
2.5000 [IU]/h | INTRAVENOUS | Status: DC
  Administered 2017-07-02: 200 mL via INTRAVENOUS
  Filled 2017-07-01 (×2): qty 1000

## 2017-07-01 MED ORDER — OXYCODONE-ACETAMINOPHEN 5-325 MG PO TABS: 2.0000 | ORAL_TABLET | ORAL | Status: DC | PRN

## 2017-07-02 ENCOUNTER — Inpatient Hospital Stay: Payer: Medicaid Other | Admitting: Anesthesiology

## 2017-07-02 ENCOUNTER — Encounter
Admission: RE | Disposition: A | Discharge status: Home / Self Care | Payer: Self-pay | Attending: Obstetrics and Gynecology

## 2017-07-02 ENCOUNTER — Other Ambulatory Visit: Payer: Self-pay

## 2017-07-02 DIAGNOSIS — Z9889 Other specified postprocedural states: Secondary | ICD-10-CM

## 2017-07-02 DIAGNOSIS — Z8759 Personal history of other complications of pregnancy, childbirth and the puerperium: Secondary | ICD-10-CM

## 2017-07-02 LAB — URINE DRUG SCREEN, QUALITATIVE (ARMC ONLY)
Amphetamines, Ur Screen: NOT DETECTED
BARBITURATES, UR SCREEN: NOT DETECTED
Benzodiazepine, Ur Scrn: NOT DETECTED
CANNABINOID 50 NG, UR ~~LOC~~: POSITIVE — AB
Cocaine Metabolite,Ur ~~LOC~~: NOT DETECTED
MDMA (ECSTASY) UR SCREEN: NOT DETECTED
METHADONE SCREEN, URINE: NOT DETECTED
Opiate, Ur Screen: NOT DETECTED
Phencyclidine (PCP) Ur S: NOT DETECTED
TRICYCLIC, UR SCREEN: NOT DETECTED

## 2017-07-02 LAB — PREPARE RBC (CROSSMATCH)

## 2017-07-02 SURGERY — Surgical Case
Anesthesia: Epidural

## 2017-07-02 MED ORDER — MISOPROSTOL 200 MCG PO TABS
ORAL_TABLET | ORAL | Status: AC
  Administered 2017-07-02: 25 ug via BUCCAL
  Filled 2017-07-02: qty 4

## 2017-07-02 MED ORDER — SIMETHICONE 80 MG PO CHEW
80.0000 mg | CHEWABLE_TABLET | ORAL | Status: DC
  Administered 2017-07-02 – 2017-07-05 (×3): 80 mg via ORAL
  Filled 2017-07-02 (×3): qty 1

## 2017-07-02 MED ORDER — BUPIVACAINE LIPOSOME 1.3 % IJ SUSP
20.0000 mL | Freq: Once | INTRAMUSCULAR | Status: DC
  Filled 2017-07-02: qty 20

## 2017-07-02 MED ORDER — DOCUSATE SODIUM 100 MG PO CAPS: 100.0000 mg | ORAL_CAPSULE | Freq: Every day | ORAL | Status: DC

## 2017-07-02 MED ORDER — TETANUS-DIPHTH-ACELL PERTUSSIS 5-2.5-18.5 LF-MCG/0.5 IM SUSP
0.5000 mL | Freq: Once | INTRAMUSCULAR | Status: DC
  Filled 2017-07-02: qty 0.5

## 2017-07-02 MED ORDER — SIMETHICONE 80 MG PO CHEW
80.0000 mg | CHEWABLE_TABLET | Freq: Three times a day (TID) | ORAL | Status: DC
  Administered 2017-07-03 – 2017-07-04 (×5): 80 mg via ORAL
  Filled 2017-07-02 (×7): qty 1

## 2017-07-02 MED ORDER — SIMETHICONE 80 MG PO CHEW
80.0000 mg | CHEWABLE_TABLET | ORAL | Status: DC | PRN
  Administered 2017-07-05: 80 mg via ORAL
  Filled 2017-07-02: qty 1

## 2017-07-02 MED ORDER — OXYCODONE HCL 5 MG/5ML PO SOLN
5.0000 mg | ORAL | Status: DC | PRN
Start: 2017-07-02 — End: 2017-07-05
  Administered 2017-07-02 – 2017-07-05 (×9): 5 mg via ORAL
  Filled 2017-07-02 (×9): qty 5

## 2017-07-02 MED ORDER — METRONIDAZOLE IN NACL 5-0.79 MG/ML-% IV SOLN
500.0000 mg | Freq: Two times a day (BID) | INTRAVENOUS | Status: DC
  Administered 2017-07-02 – 2017-07-04 (×4): 500 mg via INTRAVENOUS
  Filled 2017-07-02 (×5): qty 100

## 2017-07-02 MED ORDER — CEFAZOLIN SODIUM-DEXTROSE 2-4 GM/100ML-% IV SOLN
2.0000 g | Freq: Once | INTRAVENOUS | Status: AC
  Administered 2017-07-02: 2 g via INTRAVENOUS
  Filled 2017-07-02: qty 100

## 2017-07-02 MED ORDER — ACETAMINOPHEN 325 MG PO TABS: 650.0000 mg | ORAL_TABLET | ORAL | Status: DC | PRN

## 2017-07-02 MED ORDER — SENNOSIDES-DOCUSATE SODIUM 8.6-50 MG PO TABS
2.0000 | ORAL_TABLET | ORAL | Status: DC
  Administered 2017-07-02 – 2017-07-04 (×2): 2 via ORAL
  Filled 2017-07-02 (×3): qty 2

## 2017-07-02 MED ORDER — SODIUM CHLORIDE 0.9 % IV SOLN
INTRAVENOUS | Status: DC | PRN
  Administered 2017-07-02: 50 ug/min via INTRAVENOUS

## 2017-07-02 MED ORDER — FENTANYL CITRATE (PF) 100 MCG/2ML IJ SOLN
INTRAMUSCULAR | Status: AC
  Filled 2017-07-02: qty 2

## 2017-07-02 MED ORDER — SODIUM CHLORIDE 0.9 % IV SOLN: Freq: Once | INTRAVENOUS | Status: DC

## 2017-07-02 MED ORDER — LACTATED RINGERS IV SOLN
INTRAVENOUS | Status: DC
  Administered 2017-07-03: 06:00:00 via INTRAVENOUS

## 2017-07-02 MED ORDER — OXYCODONE HCL 5 MG PO TABS
10.0000 mg | ORAL_TABLET | ORAL | Status: DC | PRN
  Administered 2017-07-04 (×2): 10 mg via ORAL
  Filled 2017-07-02 (×2): qty 2

## 2017-07-02 MED ORDER — SODIUM CHLORIDE 0.9 % IV SOLN
INTRAVENOUS | Status: DC | PRN
  Administered 2017-07-02: 70 mL

## 2017-07-02 MED ORDER — CALCIUM CARBONATE ANTACID 500 MG PO CHEW: 2.0000 | CHEWABLE_TABLET | ORAL | Status: DC | PRN

## 2017-07-02 MED ORDER — OXYTOCIN 10 UNIT/ML IJ SOLN
INTRAMUSCULAR | Status: AC
  Filled 2017-07-02: qty 2

## 2017-07-02 MED ORDER — DIPHENHYDRAMINE HCL 25 MG PO CAPS: 25.0000 mg | ORAL_CAPSULE | Freq: Four times a day (QID) | ORAL | Status: DC | PRN

## 2017-07-02 MED ORDER — MEPERIDINE HCL 50 MG/5ML PO SOLN: 100.0000 mg | ORAL | Status: DC | PRN

## 2017-07-02 MED ORDER — DOCUSATE SODIUM 100 MG PO CAPS
100.0000 mg | ORAL_CAPSULE | Freq: Two times a day (BID) | ORAL | Status: DC
  Filled 2017-07-02: qty 1

## 2017-07-02 MED ORDER — DIBUCAINE 1 % RE OINT: 1.0000 "application " | TOPICAL_OINTMENT | RECTAL | Status: DC | PRN

## 2017-07-02 MED ORDER — SODIUM CHLORIDE 0.9 % IJ SOLN
INTRAMUSCULAR | Status: AC
  Filled 2017-07-02: qty 50

## 2017-07-02 MED ORDER — ACETAMINOPHEN 325 MG PO TABS
650.0000 mg | ORAL_TABLET | Freq: Once | ORAL | Status: AC
  Administered 2017-07-02: 650 mg via ORAL
  Filled 2017-07-02: qty 2

## 2017-07-02 MED ORDER — SODIUM CHLORIDE FLUSH 0.9 % IV SOLN
INTRAVENOUS | Status: AC
  Filled 2017-07-02: qty 10

## 2017-07-02 MED ORDER — IBUPROFEN 600 MG PO TABS
600.0000 mg | ORAL_TABLET | Freq: Four times a day (QID) | ORAL | Status: DC
  Administered 2017-07-02 – 2017-07-05 (×10): 600 mg via ORAL
  Filled 2017-07-02 (×10): qty 1

## 2017-07-02 MED ORDER — ZOLPIDEM TARTRATE 5 MG PO TABS: 5.0000 mg | ORAL_TABLET | Freq: Every evening | ORAL | Status: DC | PRN

## 2017-07-02 MED ORDER — AMMONIA AROMATIC IN INHA
RESPIRATORY_TRACT | Status: AC
  Filled 2017-07-02: qty 10

## 2017-07-02 MED ORDER — EPHEDRINE SULFATE 50 MG/ML IJ SOLN
INTRAMUSCULAR | Status: DC | PRN
  Administered 2017-07-02: 10 mg via INTRAVENOUS

## 2017-07-02 MED ORDER — FENTANYL CITRATE (PF) 100 MCG/2ML IJ SOLN
INTRAMUSCULAR | Status: DC | PRN
  Administered 2017-07-02: 20 ug via INTRATHECAL

## 2017-07-02 MED ORDER — MORPHINE SULFATE (PF) 0.5 MG/ML IJ SOLN
INTRAMUSCULAR | Status: AC
  Filled 2017-07-02: qty 10

## 2017-07-02 MED ORDER — POLYETHYLENE GLYCOL 3350 17 GM/SCOOP PO POWD
1.0000 | Freq: Two times a day (BID) | ORAL | Status: DC
  Filled 2017-07-02: qty 255

## 2017-07-02 MED ORDER — OXYTOCIN 40 UNITS IN LACTATED RINGERS INFUSION - SIMPLE MED: 2.5000 [IU]/h | INTRAVENOUS | Status: AC

## 2017-07-02 MED ORDER — PRENATAL MULTIVITAMIN CH
1.0000 | ORAL_TABLET | Freq: Every day | ORAL | Status: DC
  Administered 2017-07-03: 1 via ORAL
  Filled 2017-07-02 (×2): qty 1

## 2017-07-02 MED ORDER — PRENATAL MULTIVITAMIN CH: 1.0000 | ORAL_TABLET | Freq: Every day | ORAL | Status: DC

## 2017-07-02 MED ORDER — LIDOCAINE HCL (PF) 1 % IJ SOLN
INTRAMUSCULAR | Status: AC
  Filled 2017-07-02: qty 30

## 2017-07-02 MED ORDER — BUPIVACAINE IN DEXTROSE 0.75-8.25 % IT SOLN
INTRATHECAL | Status: DC | PRN
  Administered 2017-07-02: 1.5 mL via INTRATHECAL

## 2017-07-02 MED ORDER — BUPIVACAINE HCL (PF) 0.5 % IJ SOLN
INTRAMUSCULAR | Status: DC | PRN
  Administered 2017-07-02: 30 mL

## 2017-07-02 MED ORDER — FENTANYL CITRATE (PF) 100 MCG/2ML IJ SOLN
50.0000 ug | Freq: Once | INTRAMUSCULAR | Status: AC
  Administered 2017-07-02: 50 ug via INTRAVENOUS
  Filled 2017-07-02: qty 2

## 2017-07-02 MED ORDER — PHENYLEPHRINE HCL 10 MG/ML IJ SOLN
INTRAMUSCULAR | Status: DC | PRN
  Administered 2017-07-02: 100 ug via INTRAVENOUS

## 2017-07-02 MED ORDER — OXYCODONE HCL 5 MG PO TABS
5.0000 mg | ORAL_TABLET | ORAL | Status: DC | PRN
  Filled 2017-07-02: qty 1

## 2017-07-02 MED ORDER — FENTANYL CITRATE (PF) 100 MCG/2ML IJ SOLN
25.0000 ug | INTRAMUSCULAR | Status: DC | PRN
  Administered 2017-07-02 (×3): 25 ug via INTRAVENOUS
  Filled 2017-07-02 (×2): qty 2

## 2017-07-02 MED ORDER — MENTHOL 3 MG MT LOZG
1.0000 | LOZENGE | OROMUCOSAL | Status: DC | PRN
  Filled 2017-07-02: qty 9

## 2017-07-02 MED ORDER — WITCH HAZEL-GLYCERIN EX PADS: 1.0000 "application " | MEDICATED_PAD | CUTANEOUS | Status: DC | PRN

## 2017-07-02 MED ORDER — ONDANSETRON HCL 4 MG/2ML IJ SOLN: 4.0000 mg | Freq: Once | INTRAMUSCULAR | Status: DC | PRN

## 2017-07-02 MED ORDER — FENTANYL CITRATE (PF) 100 MCG/2ML IJ SOLN
50.0000 ug | Freq: Once | INTRAMUSCULAR | Status: AC
  Administered 2017-07-02: 50 ug via INTRAVENOUS

## 2017-07-02 MED ORDER — EPINEPHRINE PF 1 MG/ML IJ SOLN
INTRAMUSCULAR | Status: DC | PRN
  Administered 2017-07-02: 10 mg via INTRAVENOUS

## 2017-07-02 MED ORDER — BUPIVACAINE HCL (PF) 0.5 % IJ SOLN
INTRAMUSCULAR | Status: AC
  Filled 2017-07-02: qty 30

## 2017-07-02 MED ORDER — COCONUT OIL OIL: 1.0000 "application " | TOPICAL_OIL | Status: DC | PRN

## 2017-07-02 SURGICAL SUPPLY — 27 items
BARRIER ADHS 3X4 INTERCEED (GAUZE/BANDAGES/DRESSINGS) ×3 IMPLANT
CANISTER SUCT 3000ML PPV (MISCELLANEOUS) ×3 IMPLANT
CHLORAPREP W/TINT 26ML (MISCELLANEOUS) ×3 IMPLANT
DRSG TELFA 3X8 NADH (GAUZE/BANDAGES/DRESSINGS) ×3 IMPLANT
ELECT CAUTERY BLADE 6.4 (BLADE) ×3 IMPLANT
ELECT REM PT RETURN 9FT ADLT (ELECTROSURGICAL) ×3
ELECTRODE REM PT RTRN 9FT ADLT (ELECTROSURGICAL) ×1 IMPLANT
GAUZE SPONGE 4X4 12PLY STRL (GAUZE/BANDAGES/DRESSINGS) ×3 IMPLANT
GLOVE BIO SURGEON STRL SZ8 (GLOVE) ×15 IMPLANT
GOWN STRL REUS W/ TWL LRG LVL3 (GOWN DISPOSABLE) ×2 IMPLANT
GOWN STRL REUS W/ TWL XL LVL3 (GOWN DISPOSABLE) ×1 IMPLANT
GOWN STRL REUS W/TWL LRG LVL3 (GOWN DISPOSABLE) ×4
GOWN STRL REUS W/TWL XL LVL3 (GOWN DISPOSABLE) ×2
NDL HPO THNWL 1X22GA REG BVL (NEEDLE) ×1 IMPLANT
NEEDLE HYPO 22GX1.5 SAFETY (NEEDLE) ×3 IMPLANT
NEEDLE SAFETY 22GX1 (NEEDLE) ×2
NS IRRIG 1000ML POUR BTL (IV SOLUTION) ×3 IMPLANT
PACK C SECTION AR (MISCELLANEOUS) ×3 IMPLANT
PAD OB MATERNITY 4.3X12.25 (PERSONAL CARE ITEMS) ×3 IMPLANT
PAD PREP 24X41 OB/GYN DISP (PERSONAL CARE ITEMS) ×3 IMPLANT
STAPLER INSORB 30 2030 C-SECTI (MISCELLANEOUS) ×3 IMPLANT
STRAP SAFETY BODY (MISCELLANEOUS) ×3 IMPLANT
SUCT VACUUM KIWI BELL (SUCTIONS) ×3 IMPLANT
SUT CHROMIC 1 CTX 36 (SUTURE) ×9 IMPLANT
SUT PLAIN GUT 0 (SUTURE) ×6 IMPLANT
SUT VIC AB 0 CT1 36 (SUTURE) ×6 IMPLANT
SYR 30ML LL (SYRINGE) ×6 IMPLANT

## 2017-07-02 NOTE — Progress Notes (Signed)
[id number] -  Attempted SVE, pt unable to tolerate and RN unable to determine cervical dilation d/t large amount of fecal impaction blocking entry for exam.   Soap sud enema performed. Patient's s/o reports she had enemas x2 day when she was last in hospital and still she has large amount of stool.

## 2017-07-02 NOTE — Discharge Summary (Signed)
Obstetric Discharge Summary   Patient ID: Patient Name: Tamara [name] DOB: [birth year] MRN: [id number]  Date of Admission: 07/01/2017 Date of Delivery: 07/05/2017 Delivered by: Jennell Corner MD Date of Discharge: 07/05/17  Primary OB: unassigned LMP:No LMP recorded. 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 Personal history of previous postdates pregnancy 07/02/2017  . Postoperative state 07/02/2017  . No prenatal care in current pregnancy in third trimester 06/28/2017    Augmentation: Cytotec Complications: stool impaction  Intrapartum complications/course: admitted and started on buccal cytotec . Digital rectal disimpaction yielded large amount of stool , however she still has a significant obstruction noted on exam . Presenting part OOP . Underwent a LTCS for vaginal vault obstruction . Female ApGARS 9/9 weight #6/7 Delivery Type: primary cesarean section, low transverse incision Anesthesia: spinal Placenta:manual Laceration: none Episiotomy: n/a  Newborn Data: Female #6/7 Apgars 9/9    Postpartum Course  Patient had an uncomplicated postpartum course.  Plans for Depo Provera. Serious bowel regimen given. Plan for outpatient GI consult. - Trichomonas on admission: Does not swallow pills, but was given 2g IV metronidazole as treatment. Partner given script for same. - Hx of THC and cocaine in early pregnancy, no cocaine through pregnancy. THC in urine, s/p social work visit   By time of discharge on POD#3 her pain was controlled on oral pain medications; she had appropriate lochia and was ambulating, voiding without difficulty and tolerating regular diet.  She was deemed stable for  discharge to home.    Recommend f/u Gastroenterology Department of the [facility] as an outpatient.      Labs: CBC Latest Ref Rng & Units 07/03/2017 07/01/2017 06/28/2017  WBC 3.6 - 11.0 K/uL 14.6(H) 10.7 11.8(H)  Hemoglobin 12.0 - 16.0 g/dL 4.0(J) 8.1(X) 9.1(Y)  Hematocrit 35.0 - 47.0 % 29.1(L) 25.0(L) 25.9(L)  Platelets 150 - 440 K/uL 334 439 424   A NEG  Physical exam:  BP (!) 143/64 (BP Location: Right Arm)   Pulse (!) 49   Temp 98.1 F (36.7 C) (Oral)   Resp 18   Ht 4\' 11"  (1.499 m)   Wt 59.9 kg (132 lb)   SpO2 98%   Breastfeeding? Unknown   BMI 26.66 kg/m  General: alert and no distress Pulm: normal respiratory effort Lochia: appropriate Abdomen: soft, NT Uterine Fundus: firm, below umbilicus Extremities: No evidence of DVT seen on physical exam. No lower extremity edema.   Disposition: stable, discharge to home Baby Feeding:  formula Baby Disposition: home with mom  Contraception: DepoProvera  Prenatal Labs:  ABO, Rh: --/--/A NEG (01/12 2326) Antibody: POS (01/12 2326) Rubella: 1.65 (01/09 2339) RPR: Non Reactive (01/09 2339)  HBsAg: Negative (01/09 2339)  HIV: NON REACTIVE (01/09 2339)  NWG:[id number]     Plan:  Genene Churn was discharged to home in good condition. Follow-up appointment at [facility] with delivery provider in 2 weeks Will call to make appointment at [facility] as outpatient   Discharge Instructions: Per After Visit Summary. Activity: Advance as tolerated. Pelvic rest for 6 weeks.   Diet: Regular Discharge Medications: Allergies as of 07/05/2017   No Known Allergies     Medication List    TAKE these medications   docusate sodium 100 MG capsule Commonly  known as:  COLACE Take 1 capsule (100 mg total) by mouth 2 (two) times daily for 14 days. To keep stools soft, as needed   ferrous sulfate 325 (65 FE) MG EC tablet Take 1 tablet (325 mg total) by mouth 2 (two) times daily.   ibuprofen 800 MG  tablet Commonly known as:  ADVIL,MOTRIN Take 1 tablet (800 mg total) by mouth every 8 (eight) hours as needed for moderate pain or cramping.   oxyCODONE-acetaminophen 5-325 MG tablet Commonly known as:  PERCOCET/ROXICET Take 1-2 tablets by mouth every 6 (six) hours as needed for severe pain.   prenatal multivitamin Tabs tablet Take 1 tablet by mouth daily at 12 noon.      Outpatient follow up:  GI  GYN  Signed:  Christeen DouglasBethany Akera Snowberger 07/05/17

## 2017-07-02 NOTE — H&P (Signed)
Tamara [name] is a 22 y.o. female presenting for admission for Poor prenatal care , fecal impaction and induction of labor . One documented PNV at [facility] . 41+1 weeks based on LMP . [facility] was [facility]17 for abd pain . Pt could not undergo cervical exam because she she had too much stool impaction in rectal vault . H/O chronic constipation . She underwent enemas and was d/c hom ewith follow up last am 0001 for induction .+ tobacco and MJ use ( + drug screen today ) . Anemia noted on cbc from last admission . + trichomonas  And BV noted on wet mount . No treatment .  OB History    Gravida Para Term Preterm AB Living   1             SAB TAB Ectopic Multiple Live Births                 Past Medical History:  Diagnosis Date  . Anemia   . Constipation during pregnancy   . Late prenatal care    at 24 wks  . Marijuana use    2 wks ago from today 07/01/17  . Medical history non-contributory   . Noncompliance   . Substance abuse (HCC)    cocaine, 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  . UTI (urinary tract infection) during pregnancy    Past Surgical History:  Procedure Laterality Date  . NO PAST SURGERIES     Family History: family history is not on file. Social History:  reports that she has been smoking cigarettes.  She has a 5.00 pack-year smoking history. she has never used smokeless tobacco. She reports that she uses drugs. Drug: Marijuana. She reports that she does not drink alcohol.     Maternal Diabetes: no done Genetic Screening: not done Maternal Ultrasounds/Referrals: no anatomy Fetal Ultrasounds or other Referrals:   Maternal Substance Abuse:  Yes:  Type: Marijuana Significant Maternal Medications:  None Significant Maternal Lab Results:  Lab values include: Other: anemia Other Comments:    ROS  General : no weight loss Eyes: no vision change  Endocrine : no thyroid ds, no cold intolerance Pulm : no SOB  CV : no chest pain  GI: ++ cramping , ++ constipation , no  hematochezia  GU + STD  M/S : no weakness Neuro: no seizure activity   History Dilation: (attempted, unable to det d/t impaction and pt not tolerating) Exam by:: N. Aten, RN Blood pressure (!) 112/44, pulse 67, temperature 98.9 F (37.2 C), resp. rate 16, height 4\' 11"  (1.499 m), weight 59.9 kg (132 lb). Exam Physical Exam  Perrla  No thyromegaly  CV RRR  Lungs CTA   Abd: gravid  Pelvic : ++ rectal stool impaction obstructing vag vault NST : reactive , 120-130 + accels , no decels ctx not regular  Prenatal labs: ABO, Rh: --/--/A NEG (01/12 2326) Antibody: POS (01/12 2326) Rubella: 1.65 (01/09 2339) RPR: Non Reactive (01/09 2339)  HBsAg: Negative (01/09 2339)  HIV: NON REACTIVE (01/09 2339)  ZOX:WRUEAVWUGBS:negative      Assessment/Plan: Postdates pregnancy by LMP , recent u/s c/w term  Poor prenatal care  Drug MJ  + Tobacco use  + BV and Trich - start IV flagyl for pt and finish po tx PP , Rx given to partner  Anemia HCT 25.0 MCV=77. Currently asymptomatic  At risk for blood transfusion  Will crossmatch and hold if needed  REctal disimpaction performed with large amount  clay like stool removed ( premedicated with 100 mcg Fentanyl ) Induction 2 doses of buccal cytotec given last pm , will continue with vaginal    Ihor Austin Kennidi Yoshida 07/02/2017, 9:42 AM

## 2017-07-02 NOTE — Anesthesia Procedure Notes (Signed)
Spinal  Patient location during procedure: OR Staffing Performed: anesthesiologist  Preanesthetic Checklist Completed: patient identified, site marked, surgical consent, pre-op evaluation, timeout performed, IV checked, risks and benefits discussed and monitors and equipment checked Spinal Block Patient position: sitting Prep: Betadine Patient monitoring: heart rate, continuous pulse ox, blood pressure and cardiac monitor Approach: midline Location: L4-5 Injection technique: single-shot Needle Needle type: Whitacre and Introducer  Needle gauge: 24 G Needle length: 9 cm Assessment Sensory level: T4 Additional Notes Negative paresthesia. Negative blood return. Positive free-flowing CSF. Expiration date of kit checked and confirmed. Patient tolerated procedure well, without complications.       

## 2017-07-02 NOTE — Progress Notes (Signed)
[id number] - RN at bedside to see pt lying in bed on Rt side resting with eyes closed, pt states she has been getting up and down to restroom to poop, not really able to sleep and starting to feel mild abdominal cramps. Pt denies any needs at this time and cramps are tolerable. FHT reactive, VSS.

## 2017-07-02 NOTE — Progress Notes (Signed)
Soap suds enema administered rectal, pt tolerating well.

## 2017-07-02 NOTE — Plan of Care (Signed)
2240 - [facility] with family and s/o present, no acute distress noted. Pt presents for scheduled IOL, postdates with hx substance abuse and late prenatal care this pregnancy. Pt confirms +FM, denies any signs of labor, painful contractions or cramping. GBS neg. Questions about induction process addressed. Pt and family verbalized understanding.

## 2017-07-02 NOTE — Anesthesia Preprocedure Evaluation (Signed)
Anesthesia Evaluation  Patient identified by MRN, date of birth, ID band Patient awake    Reviewed: Allergy & Precautions, H&P , NPO status , Patient's Chart, lab work & pertinent test results, reviewed documented beta blocker date and time   Airway Mallampati: II  TM Distance: >3 FB Neck ROM: full    Dental no notable dental hx. (+) Teeth Intact   Pulmonary neg pulmonary ROS, Current Smoker,    Pulmonary exam normal breath sounds clear to auscultation       Cardiovascular Exercise Tolerance: Good negative cardio ROS   Rhythm:regular Rate:Normal     Neuro/Psych negative neurological ROS  negative psych ROS   GI/Hepatic negative GI ROS, Neg liver ROS,   Endo/Other  negative endocrine ROSdiabetes  Renal/GU      Musculoskeletal   Abdominal   Peds  Hematology negative hematology ROS (+) anemia ,   Anesthesia Other Findings   Reproductive/Obstetrics (+) Pregnancy                             Anesthesia Physical Anesthesia Plan  ASA: II and emergent  Anesthesia Plan: Epidural   Post-op Pain Management:    Induction:   PONV Risk Score and Plan:   Airway Management Planned:   Additional Equipment:   Intra-op Plan:   Post-operative Plan:   Informed Consent: I have reviewed the patients History and Physical, chart, labs and discussed the procedure including the risks, benefits and alternatives for the proposed anesthesia with the patient or authorized representative who has indicated his/her understanding and acceptance.     Plan Discussed with:   Anesthesia Plan Comments:         Anesthesia Quick Evaluation

## 2017-07-02 NOTE — Progress Notes (Signed)
Dr Feliberto GottronSchermerhorn called, provided update to pt induction status, soap sud enemas given with frequent loose stool and solid impaction remains, still unable to perform cervical exam. Pt uncomfortable with attempts to check cervix and unable to perform exam d/t large stool impacted that likely requires manual deimpaction. Explained Cytotec x2 doses per buccal was given. Cat 1 tracing with ctx every 1-3 mins, per MD, hold Cytotec and will come see pt this morning.

## 2017-07-02 NOTE — Consult Note (Signed)
Neonatology Note:   Attendance at C-section:    I was asked by Dr. Schermerhorn to attend this primary C/S at 41+ weeks due to fecal impaction and concern for. The mother is a G1P0 A neg, GBS negative with late/limited PNC, fecal impaction, and a history of cocaine use (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). She is a cigarette smoker and also uses marijuana. Her UDS was positive only for marijuana. She is positive for BV and Trichomonas (1/10) for which she has not been treated. She got 2 doses of IV Fentanyl this morning, about 5 hours before delivery. ROM at delivery, fluid clear. Infant vigorous with good spontaneous cry and tone. Delayed cord clamping was done. Needed no suctioning. Ap 9/9. Lungs clear to ausc in DR.   I was called at about 20 minutes of life by transition nurse. Infant had noraml O2 saturations in room air, but was grunting audibly and having retractions. I asked her to bring the baby to SCN to be transitioned there.   Chin Wachter C. Tamara Zwack, MD   

## 2017-07-02 NOTE — Plan of Care (Signed)
Pt. Is alert and oriented with aprop. Affect. Color good, skin w&d. BBS clear. Abd. Dressing has small amount of serous drainage; margins marked and remains within marked margins. Fundus is firm at U/E. Lochia is scant. Instructed in purpose and use of Incentive Spirometer and Imp. Of early ambulation. V/O. IS Goal 1500 and reached 1000. She c/o lack of sleep and dozes easily. Foley Cath to straight drain, draining clear amber urine. States pain has improved to a "5" since arriving to unit and sleeps easliy between care times.  Oriented to room and POC. Verbalizes agreement and understanding. Database administratornfant Safety and Security as well as Safe sleep instructed and Mom v/o.

## 2017-07-02 NOTE — Progress Notes (Signed)
[employer] Pharmacy Tech. States [facility] does not have Demerol Elixir and that the Pharmacist will cal Dr. Denice BorsSchermehorn to find alternate pain medication.

## 2017-07-02 NOTE — Anesthesia Post-op Follow-up Note (Signed)
Anesthesia QCDR form completed.        

## 2017-07-02 NOTE — Progress Notes (Signed)
Patient ID: Tamara [name], female   DOB: May 16, 1996, 22 y.o.   MRN: [id number] Exam after 30 min disimpaction  Reveals still large bolus of firm stool still obstructing vaginal canal . Fetus OOP . Cx could only be reached with 1 digit finger .  A; large stool impaction obstruction vaginal canal  P: spoke to the pt about role of primary LTCS . Odds are that even if induction could progress, fetal head will not navigate vaginal canal with the amt of stool impaction .  She agrees to [facility] [employer] . Risks discussed . I will order 1 unit of PRBC now before surgery since her HCT is 25%. Mother is in room . All questions have been discussed with pt .

## 2017-07-02 NOTE — Brief Op Note (Signed)
07/01/2017 - 07/02/2017  3:53 PM  PATIENT:  Genene ChurnBriannie L Rego  22 y.o. female  PRE-OPERATIVE DIAGNOSIS:  large stool impaction obstructing vaginal canal  POST-OPERATIVE DIAGNOSIS:  same  PROCEDURE:  Procedure(s): CESAREAN SECTION (N/A) LTCS SURGEON:  Surgeon(s) and Role:    * Schermerhorn, Ihor Austinhomas J, MD - Primary  PHYSICIAN ASSISTANT: scrub tech  ASSISTANTS: none   ANESTHESIA:   spinal  EBL:  700 mL   BLOOD ADMINISTERED:none  DRAINS: Urinary Catheter (Foley)   LOCAL MEDICATIONS USED:  MARCAINE    and BUPIVICAINE   SPECIMEN:  No Specimen  DISPOSITION OF SPECIMEN:  N/A  COUNTS:  YES  TOURNIQUET:  * No tourniquets in log *  DICTATION: .Other Dictation: Dictation Number verbal  PLAN OF CARE: Admit to inpatient   PATIENT DISPOSITION:  PACU - hemodynamically stable.   Delay start of Pharmacological VTE agent (>24hrs) due to surgical blood loss or risk of bleeding: not applicable

## 2017-07-02 NOTE — Progress Notes (Signed)
2330 - Dr Feliberto GottronSchermerhorn paged, call returned, explained prolonged fetal decel x5-6 mins, FHR down in 70's d/t maternal vagal response while attempting to place PIV. FHR has since recovered with accels and moderate variability following patient return to calm state and interventions, reposition and O2, tried to perform cervical exam but pt was seen and received enema during last visit, pt still has a large amount of fecal impaction, pt not tolerating exam and RN's x2 unable to reach cervix. Requested order for enema and order received to administer Cytotec 25mcg buccal q4 hours, soap suds enema q2 hours.

## 2017-07-02 NOTE — Transfer of Care (Signed)
Immediate Anesthesia Transfer of Care Note  Patient: Genene ChurnBriannie L Alcalde  Procedure(s) Performed: CESAREAN SECTION (N/A )  Patient Location: PACU  Anesthesia Type:Spinal  Level of Consciousness: awake, alert  and oriented  Airway & Oxygen Therapy: Patient Spontanous Breathing  Post-op Assessment: Report given to RN and Post -op Vital signs reviewed and stable  Post vital signs: Reviewed and stable  Last Vitals:  Vitals:   07/02/17 1342 07/02/17 1428  BP: (!) 107/57 (!) 113/53  Pulse: 69 76  Resp:  16  Temp:  37.2 C    Last Pain:  Vitals:   07/02/17 1428  TempSrc: Oral  PainSc:          Complications: No apparent anesthesia complications

## 2017-07-02 NOTE — Progress Notes (Addendum)
Matt McBane, Pharmacist, called unit and and stated Demerol Elixir is not available and ask if Pt. Could not receive crushed Oxy.  Notified Mat. That Pt. wa not to receive this until 12h post- partum. Also, informed him that Pt. Has great difficulty with any type of pill. Matt stated Oxy can be prescribed in elixir form. [facility] Dr. Feliberto GottronSchermerhorn to determine apropriate medication based on Pharmacy availability. Matt stated he will call Dr. Feliberto GottronSchermerhorn.

## 2017-07-03 ENCOUNTER — Inpatient Hospital Stay: Payer: Medicaid Other

## 2017-07-03 ENCOUNTER — Encounter: Payer: Self-pay | Admitting: Radiology

## 2017-07-03 LAB — CBC
HEMATOCRIT: 29.1 % — AB (ref 35.0–47.0)
Hemoglobin: 9.4 g/dL — ABNORMAL LOW (ref 12.0–16.0)
MCH: 25.3 pg — ABNORMAL LOW (ref 26.0–34.0)
MCHC: 32.4 g/dL (ref 32.0–36.0)
MCV: 78.1 fL — ABNORMAL LOW (ref 80.0–100.0)
PLATELETS: 334 10*3/uL (ref 150–440)
RBC: 3.73 MIL/uL — ABNORMAL LOW (ref 3.80–5.20)
RDW: 16.2 % — AB (ref 11.5–14.5)
WBC: 14.6 10*3/uL — AB (ref 3.6–11.0)

## 2017-07-03 LAB — RPR: RPR Ser Ql: NONREACTIVE

## 2017-07-03 MED ORDER — INFLUENZA VAC SPLIT QUAD 0.5 ML IM SUSY: 0.5000 mL | PREFILLED_SYRINGE | INTRAMUSCULAR | Status: DC

## 2017-07-03 MED ORDER — DOCUSATE SODIUM 50 MG/5ML PO LIQD
100.0000 mg | Freq: Two times a day (BID) | ORAL | Status: DC
  Administered 2017-07-03 – 2017-07-05 (×5): 100 mg via ORAL
  Filled 2017-07-03 (×6): qty 10

## 2017-07-03 NOTE — Anesthesia Postprocedure Evaluation (Signed)
Anesthesia Post Note  Patient: Tamara [name]  Procedure(s) Performed: CESAREAN SECTION (N/A )  Patient location during evaluation: L&[name] Anesthesia Type: Spinal Level of consciousness: awake and alert and oriented Pain management: pain level controlled Vital Signs Assessment: post-procedure vital signs reviewed and stable Respiratory status: respiratory function stable Cardiovascular status: stable Postop Assessment: no headache, no backache, spinal receding, no apparent nausea or vomiting, patient able to bend at knees and adequate PO intake Anesthetic complications: no     Last Vitals:  Vitals:   07/03/17 0330 07/03/17 0332  BP:  105/69  Pulse: 71 68  Resp: 18   Temp: 36.6 C   SpO2: 95% 97%    Last Pain:  Vitals:   07/03/17 0604  TempSrc:   PainSc: 5                  Clydene PughBeane, Tamara [name]

## 2017-07-03 NOTE — Anesthesia Post-op Follow-up Note (Signed)
  Anesthesia Pain Follow-up Note  Patient: Tamara [name]  Day #: 1  Date of Follow-up: 07/03/2017 Time: 7:02 AM  Last Vitals:  Vitals:   07/03/17 0330 07/03/17 0332  BP:  105/69  Pulse: 71 68  Resp: 18   Temp: 36.6 C   SpO2: 95% 97%    Level of Consciousness: alert  Pain: mild   Side Effects:None  Catheter Site Exam:clean, dry     Plan: D/C from anesthesia care at surgeon's request  Clydene PughBeane, Hashir Deleeuw D

## 2017-07-03 NOTE — Progress Notes (Signed)
Subjective: Postpartum Day 1/POD#1 Cesarean Delivery Patient reports sore around the incision   Objective: Vital signs in last 24 hours: Temp:  [97.6 F (36.4 C)-99.5 F (37.5 C)] 98 F (36.7 C) (01/14 0822) Pulse Rate:  [59-124] 67 (01/14 0822) Resp:  [12-25] 18 (01/14 0822) BP: (88-143)/(42-72) 113/52 (01/14 0822) SpO2:  [95 %-100 %] 99 % (01/14 16100822)  Physical Exam:  General: A,A&O x3 Lochia: mod, no clots Uterine Fundus: FF, U-2 Incision:TTP, Dressing is intact DVT Evaluation: Neg Homans +slight BS heard  Recent Labs    07/01/17 2326 07/03/17 0436  HGB 8.5* 9.4*  HCT 25.0* 29.1*    Assessment/Plan: Status post Cesarean section. Doing well postoperatively.  Continue current care.  Sharee Pimplearon W Jones 07/03/2017, 9:05 AM

## 2017-07-03 NOTE — Op Note (Signed)
NAME:  RANATA, LAUGHERY NO.:  [id number]  MEDICAL RECORD NO.:  [id number]  LOCATION:                                 FACILITY:  PHYSICIAN:  Jennell Corner, MD     DATE OF BIRTH:  DATE OF PROCEDURE:  07/02/2017 DATE OF DISCHARGE:                              OPERATIVE REPORT   PREOPERATIVE DIAGNOSIS: 1. A 41 +1 weeks' estimated gestational age. 2. Vaginal vault obstruction secondary to severe rectal stool     impaction.  POSTOPERATIVE DIAGNOSIS: 1. A 41 +1 weeks' estimated gestational age. 2. Vaginal vault obstruction secondary to severe rectal stool     impaction.  PROCEDURE PERFORMED:  Primary low transverse cesarean section.  SURGEON:  Jennell Corner, MD  ANESTHESIA:  Spinal.  FIRST ASSISTANT:  Scrub tech.  INDICATIONS:  A 22 year old, gravida 1, para 0 patient with poor prenatal care, was admitted to [facility] 3 days prior to the procedure.  She was noted to have severe rectal distention with inability to undergo cervical check due to the amount of stool that was bulging into the vaginal vault.  The patient had several soapsuds enemas that alleviated some stool.  She was discharged to home and followed up for induction late p.m. on July 01, 2017.  On July 02, 2017, the patient underwent examination, which again revealed significant distention into the vaginal vault.  The patient underwent a 30-minute digital rectal disimpaction with a large amount of stool that was extracted.  A repeat vaginal exam still demonstrated significant encroachment into the vaginal vault with the cervix extremely high and presenting part out of the pelvis.  Based on counseling with the amount of distention the patient had, it was my recommendation that she undergo a primary low transverse cesarean section, which she agreed to.  DESCRIPTION OF PROCEDURE:  After adequate spinal anesthesia, the patient was placed in dorsal supine  position with hip roll under the right side. The patient's abdomen was prepped and draped in normal sterile fashion. The patient did receive 2 g IV Ancef prior to commencement of the case. Time-out was performed.  Pfannenstiel incision was made 2 fingerbreadths above the symphysis pubis.  Sharp dissection was used to identify the fascia.  The fascia was opened in the midline and opened in a transverse fashion.  The superior aspect of the fascia was grasped with Kocher clamps and the recti muscles were dissected free.  Inferior aspect of the fascia was grasped with Kocher clamps and the pyramidalis muscle was dissected free.  Entry into the peritoneal cavity was accomplished sharply.  The vesicouterine peritoneal fold was identified and a bladder flap was created and the bladder was reflected inferiorly.  A low transverse uterine incision was made upon entry into the endometrial cavity.  Clear fluid resulted.  Fetal head was brought to the incision and Kiwi vacuum was applied to the occiput and with 1 gentle pull, the head was delivered and the vacuum was removed.  The shoulders and the body were delivered without difficulty.  Vigorous female was then dried on the abdomen and with delayed cord clamping at 60 seconds, the cord was doubly clamped and the vigorous female whose  birth time was [id number] on July 02, 2017, was passed to the pediatrician staff, who assigned Apgar scores of 9 and 9.  Fetal weight 6 pounds 7 ounces (2930 g).  The placenta was manually delivered and the uterus was exteriorized.  The endometrial cavity was wiped clean with laparotomy tape and the ring forceps was used to open the cervix and this was passed off the operative field.  Uterine incision was then closed with a running 1 chromic suture.  Good approximation of edges, good hemostasis noted. Fallopian tubes and ovaries appeared normal.  The posterior cul-de-sac was irrigated and suctioned.  Uterus was placed back into  the abdominal cavity and again the uterine incision appeared hemostatic.  The pericolic gutters were wiped clean with laparotomy tape and Interceed was placed over the uterine incision in T-shaped fashion.  The fascia was then closed with 0 Vicryl suture in a running nonlocking fashion. Good approximation of edges, good hemostasis noted.  The fascial edges were then injected with a solution of 1.3% bupivacaine, 20 mL of this solution with 30 mL of 0.5% Marcaine and 50 mL normal saline were mixed. This solution was injected along the fascial edge 60 mL were used. Bovie was used to cauterize any bleeding areas in the subcutaneous tissue and the skin was then reapproximated with Insorb absorbable staples.  An additional 30 mL of Exparel solution was used to inject the skin.  There were no complications.  ESTIMATED BLOOD LOSS:  700 mL.  INTRAOPERATIVE FLUIDS:  600 mL.  URINE OUTPUT:  150 mL.  The patient was taken to recovery room in good condition.    ______________________________ Jennell Cornerhomas Andrea Ferrer, MD   ______________________________ Jennell Cornerhomas Kayleen Alig, MD    TS/MEDQ  D:  07/02/2017  T:  07/02/2017  Job:  [id number]

## 2017-07-03 NOTE — Clinical Social Work Maternal (Signed)
  CLINICAL SOCIAL WORK MATERNAL/CHILD NOTE  Patient Details  Name: Genene ChurnBriannie L Hornik MRN: [id number] Date of Birth: 13-Apr-1996  Date:  07/03/2017  Clinical Social Worker Initiating Note:  York SpanielMonica Kamsiyochukwu Spickler MSW,LCSW Date/Time: Initiated:  07/03/17/      Child's Name:      Biological Parents:  Mother, Father   Need for Interpreter:  None   Reason for Referral:  Current Substance Use/Substance Use During Pregnancy    Address:  [address] [place] [address]    Phone number:  [phone] (home)     Additional phone number: none  Household Members/Support Persons (HM/SP):       HM/SP Name Relationship DOB or Age  HM/SP -1        HM/SP -2        HM/SP -3        HM/SP -4        HM/SP -5        HM/SP -6        HM/SP -7        HM/SP -8          Natural Supports (not living in the home):  Friends   Professional Supports: None   Employment: Unemployed   Type of Work:     Education:      Homebound arranged:    Surveyor, quantityinancial Resources:  Self-Pay    Other Resources:      Cultural/Religious Considerations Which May Impact Care:  none  Strengths:      Psychotropic Medications:         Pediatrician:       [place]    [place] [facility]    [place]    [place]      Pediatrician Fax Number:    Risk Factors/Current Problems:  Substance Use    Cognitive State:  Alert    Mood/Affect:  Calm , Bright , Happy    CSW Assessment: Patient and newborn tested positive for marijuana. CSW consulted to see patient as a result. CSW visited patient and her significant other was asleep in the chair and did not awaken while we were talking. Patient gave permission to speak with him in the room. CSW introduced self and explained role and purpose of visit. Patient states that she, her significant other, and her newborn will be living in the home together. Patient reports they have everything they need for  there newborn and that this is her first child. Patient reports no concerns regarding necessities in the home, transportation, or obtaining resources. CSW spoke with patient about her marijuana use and history of cocaine use. Patient stated that she used marijuana throughout the pregnancy and that she did not know she was pregnant until she was three months along. She admitted to using cocaine during the first three months but states she did not use after she found out about her pregnancy. During the conversation, patient did not appear concerned about her drug use but did states she does not plan on using any longer but was not interested in resources. CSW explained that DSS CPS would be notified and she had no questions.   CSW Plan/Description:  Child Protective Service Report     York SpanielMonica Jaimarie Rapozo, LCSW 07/03/2017, 3:09 PM

## 2017-07-04 MED ORDER — POLYETHYLENE GLYCOL 3350 17 G PO PACK
17.0000 g | PACK | Freq: Two times a day (BID) | ORAL | Status: DC
  Administered 2017-07-04 – 2017-07-05 (×3): 17 g via ORAL
  Filled 2017-07-04 (×4): qty 1

## 2017-07-04 MED ORDER — METRONIDAZOLE IVPB CUSTOM
2.0000 g | Freq: Once | INTRAVENOUS | Status: AC
  Administered 2017-07-04: 16:00:00 2 g via INTRAVENOUS
  Filled 2017-07-04: qty 200

## 2017-07-04 NOTE — Progress Notes (Signed)
Subjective: Postpartum Day 2: Cesarean Delivery for severe stool impaction obstructing vaginal delivery Patient reports incisional pain.  +BM after surgery. Still constipated, which is always true for her.  Objective: Vital signs in last 24 hours: Temp:  [97.9 F (36.6 C)-98.9 F (37.2 C)] 98.2 F (36.8 C) (01/15 0733) Pulse Rate:  [69-88] 76 (01/15 0733) Resp:  [18] 18 (01/15 0733) BP: (110-119)/(50-65) 111/65 (01/15 0733) SpO2:  [98 %-99 %] 99 % (01/15 0733)  Physical Exam:  General: alert, cooperative and appears stated age 64Lochia: appropriate Uterine Fundus: firm Incision: healing well, no significant drainage, no dehiscence, no significant erythema DVT Evaluation: No evidence of DVT seen on physical exam. Negative Homan's sign. No cords or calf tenderness.  Recent Labs    07/01/17 2326 07/03/17 0436  HGB 8.5* 9.4*  HCT 25.0* 29.1*    Assessment/Plan: Status post Cesarean section. Doing well postoperatively.  Continue current care. Continue bowel softners, 2x daily miralax. Senecot. Plan for GI consult as outpatient   Christeen DouglasBethany Dawnna Gritz 07/04/2017, 12:16 PM

## 2017-07-05 LAB — TYPE AND SCREEN
ABO/RH(D): A NEG
Antibody Screen: POSITIVE
Donor AG Type: NEGATIVE
Unit division: 0
Unit division: 0

## 2017-07-05 LAB — BPAM RBC
Blood Product Expiration Date: 201901182359
Blood Product Expiration Date: 201901252359
ISSUE DATE / TIME: 201901131228
Unit Type and Rh: 600
Unit Type and Rh: 600

## 2017-07-05 MED ORDER — OXYCODONE-ACETAMINOPHEN 5-325 MG PO TABS: 1.0000 | ORAL_TABLET | Freq: Four times a day (QID) | ORAL | 0 refills | Status: DC | PRN

## 2017-07-05 MED ORDER — IBUPROFEN 800 MG PO TABS: 800.0000 mg | ORAL_TABLET | Freq: Three times a day (TID) | ORAL | 1 refills | Status: DC | PRN

## 2017-07-05 MED ORDER — DOCUSATE SODIUM 100 MG PO CAPS: 100.0000 mg | ORAL_CAPSULE | Freq: Two times a day (BID) | ORAL | 0 refills | Status: AC

## 2017-07-05 NOTE — Discharge Instructions (Signed)
Postpartum Care After Cesarean Delivery °The period of time right after you deliver your newborn is called the postpartum period. °What kind of medical care will I receive? °· You may continue to receive fluids and medicines through an IV tube inserted into one of your veins. °· You may have small, flexible tube (catheter) draining urine from your bladder into a bag outside of your body. The catheter will be removed as soon as possible. °· You may be given a squirt bottle to use when you go to the bathroom. You may use this until you are comfortable wiping as usual. To use the squirt bottle, follow these steps: °? Before you urinate, fill the squirt bottle with warm water. The water should be warm. Do not use hot water. °? After you urinate, while you are sitting on the toilet, use the squirt bottle to rinse the area around your urethra and vaginal opening. This rinses away any urine and blood. °? You may do this instead of wiping. As you start healing, you may use the squirt bottle before wiping yourself. Make sure to wipe gently. °? Fill the squirt bottle with clean water every time you use the bathroom. °· You will be given sanitary pads to wear. °· Your incision will be monitored to make sure it is healing properly. You will be told when it is safe for your stitches, staples, or skin adhesive tape to be removed. °What can I expect? °· You may not feel the need to urinate for several hours after delivery. °· You will have some soreness and pain in your abdomen. You may have a small amount of blood or clear fluid coming from your incision. °· If you are breastfeeding, you may have uterine contractions every time you breastfeed for up to several weeks postpartum. Uterine contractions help your uterus return to its normal size. °· It is normal to have vaginal bleeding (lochia) after delivery. The amount and appearance of lochia is often similar to a menstrual period in the first week after delivery. It will  gradually decrease over the next few weeks to a dry, yellow-brown discharge. For most women, lochia stops completely by 6-8 weeks after delivery. Vaginal bleeding can vary from woman to woman. °· Within the first few days after delivery, you may have breast engorgement. This is when your breasts feel heavy, full, and uncomfortable. Your breasts may also throb and feel hard, tightly stretched, warm, and tender. After this occurs, you may have milk leaking from your breasts. Your health care provider can help you relieve discomfort due to breast engorgement. Breast engorgement should go away within a few days. °· You may feel more sad or worried than normal due to hormonal changes after delivery. These feelings should not last more than a few days. If these feelings do not go away after several days, speak with your health care provider. °How should I care for myself? °· Tell your health care provider if you have pain or discomfort. °· Drink enough water to keep your urine clear or pale yellow. °· Wash your hands thoroughly with soap and water for at least 20 seconds after changing your sanitary pads or using the toilet, and before holding or feeding your baby. °· If you are not breastfeeding, avoid touching your breasts a lot. Doing this can make your breasts produce more milk. °· If you become weak or lightheaded, or you feel like you might faint, ask for help before: °? Getting out of bed. °? Showering. °·   Change your sanitary pads frequently. Watch for any changes in your flow, such as a sudden increase in volume, a change in color, or the passing of large blood clots. If you pass a blood clot from your vagina, save it to show to your health care provider. Do not flush blood clots down the toilet without having your health care provider look at them.  Make sure that all your vaccinations are up to date. This can help protect you and your baby from getting certain diseases. You may need to have immunizations done  before you leave the hospital.  If desired, talk with your health care provider about methods of family planning or birth control (contraception). How can I start bonding with my baby? Spending as much time as possible with your baby is very important. During this time, you and your baby can get to know each other and develop a bond. Having your baby stay with you in your room (rooming in) can give you time to get to know your baby. Rooming in can also help you become comfortable caring for your baby. Breastfeeding can also help you bond with your baby. How can I plan for returning home with my baby?  Make sure that you have a car seat installed in your vehicle. ? Your car seat should be checked by a certified car seat installer to make sure that it is installed safely. ? Make sure that your baby fits into the car seat safely.  Ask your health care provider any questions you have about caring for yourself or your baby. Make sure that you are able to contact your health care provider with any questions after leaving the hospital. This information is not intended to replace advice given to you by your health care provider. Make sure you discuss any questions you have with your health care provider. Document Released: 02/29/2012 Document Revised: 11/09/2015 Document Reviewed: 05/11/2015 Elsevier Interactive Patient Education  [employer].      Here is a helpful article from the website http://mitchell.org/, regarding constipation  Here are reasons why constipation occurs after surgery: 1) During the operation and in the recovery room, most people are given opioid pain medication, primarily through an IV, to treat moderate or severe pain. Intravenous opioids include morphine, Dilaudid and fentanyl. After surgery, patients are often prescribed opioid pain medication to take by mouth at home, including codeine, Vicodin, Norco, and Percocet. All of these medications cause constipation by slowing  down the movement of your intestine. 2) Changes in your diet before surgery can be another culprit. It is common to get specific instructions to change how you normally eat or drink before your surgery, like only having liquids the day before or not having anything to eat or drink after midnight the night before surgery. For this reason, temporary dehydration may occur. This, along with not eating or only having liquids, means that you are getting less fiber than usual. Both these factors contribute to constipation. 3) Changes in your diet after surgery can also contribute to the problem. Although many people dont have dietary restrictions after operations, being under anesthesia can make you lose your appetite for several hours and maybe even days. Some people can even have nausea or vomiting. Not eating or drinking normally means that you are not getting enough fiber and you can get dehydrated, both leading to constipation. 4) Lying in a bed more than usual--which happens before, during and after surgery--combined with the medications and diet changes, all  work together to slow down your colon and make your poop turn to rock.  No one likes to be constipated.  Lets face it, its not a pleasant feeling when you dont poop for days, then strain on the toilet to finally pass something large enough to cause damage. An ounce of prevention is worth a pound of cure, so: 1. Assume you will be constipated. 2. Plan and prepare accordingly. Post-surgery is one of those unique situations where the temporary use of laxatives can make a world of difference. Always consult with your doctor, and recognize that if you wait several days after surgery to take a laxative, the constipation might be too severe for these over-the-counter options. It is always important to discuss all medications you plan on taking with your doctor. Ask your doctor if you can start the laxative immediately after surgery. *  Here are go-to  post-surgery laxatives: Senna: Senna is an herb that acts as a stimulant laxative, meaning it increases the activity of the intestine to cause you to have a bowel movement. It comes in many forms, but senna pills are easy to take and are sold over the counter at almost all pharmacies. Since opioid pain medications slow down the activity of the intestine, it makes sense to take a medication to help reverse that side effect. Long-term use of a stimulant laxative is not a good idea since it can make your colon lazy and not function properly; however, temporary use immediately after surgery is acceptable. In general, if you are able to eat a normal diet, taking senna soon after surgery works the best. Senna usually works within hours to produce a bowel movement, but this is less predictable when you are taking different medications after surgery. Try not to wait several days to start taking senna, as often it is too late by then. Just like with all medications or supplements, check with your doctor before starting new treatment.   Magnesium: Magnesium is an important mineral that our body needs. We get magnesium from some foods that we eat, especially foods that are high in fiber such as broccoli, almonds and whole grains. There are also magnesium-based medications used to treat constipation including milk of magnesia (magnesium hydroxide), magnesium citrate and magnesium oxide. They work by drawing water into the intestine, putting it into the class of osmotic laxatives. Magnesium products in low doses appear to be safe, but if taken in very large doses, can lead to problems such as irregular heartbeat, low blood pressure and even death. It can also affect other medications you might be taking, therefore it is important to discuss using magnesium with your physician and pharmacist before initiating therapy. Most over-the-counter magnesium laxatives work very well to help with the constipation related to  surgery, but sometimes they work too well and lead to diarrhea. Make sure you are somewhere with easy access to a bathroom, just in case.   Bisacodyl: Bisacodyl (generic name) is sold under brand names such as Dulcolax. Much like senna, it is a stimulant laxative, meaning it makes your intestines move more quickly to push out the stool. This is another good choice to start taking as soon as your doctor says you can take a laxative after surgery. It comes in pill form and as a suppository, which is a good choice for people who cannot or are not allowed to swallow pills. Studies have shown that it works as a laxative, but like most of these medications, you should use this  on a short-term basis only.   Enema: Enemas strike fear in many people, but FEAR NOT! Its nowhere near as big a deal as you may think. An enema is just a way to get some liquid into your rectum by placing a specially designed device through your anus. If you have never done one, it might seem like a painful, unpleasant, uncomfortable, complicated and lengthy procedure. But in reality, its simple, takes just a few seconds and is highly effective. The small ready-made bottles you buy at the pharmacy are much easier than the hose/large rubber container type. Those recommended positions illustrated in some instructions are generally not necessary to place the enema. Its very similar to the insertion of a tampon, requiring a slight squat. Some extra lubrication on the enemas tip (or on your anus) will make it a breeze. In certain cases, there is no substitute for a good enema. For example, if someone has not pooped for a few days, the beginning of the poop waiting to come out can become rock hard. Passing that hard stool can lead to much pain and problems like anal fissures. Inserting a little liquid to break up the rock-hard stool will help make its passage much easier. Enemas come with different liquids. Most come with saline, but there are  also mineral oil options. You can also use warm water in the reusable enema containers. They all work. But since saline can sometimes be irritating, so try a mineral oil or water enema instead.  Here are commonly recommended constipation medications that do not work well for post-surgery constipation: Docusate: Docusate (generic name) most commonly referred to as Colace (brand name) is not really a laxative, but is classified as a stool softener. Although this medication is commonly prescribed, it is not recommended for several reasons: 1) there is no good medical evidence that it works 2) even if it has an effect, which is very questionable, it is minimal and cannot combat the intestinal slowing caused by the opioid medications. Skip docusate to save money and space in your pillbox for something more effective.  PEG: Miralax (brand name) is basically a chemical called polyethylene glycol (PEG) and it has gained tremendous popularity as a laxative. This product is an osmotic laxative meaning it works by pulling water into the stool, making it softer. This is very similar to the action of natural fiber in foods and supplements. Therefore, the effect seen by this medication is not immediate, causing a bowel movement in a day or more. Is this medication strong enough to battle the constipation related to having an operation? Maybe for some people not prone to constipation. But for most people, other laxatives are better to prevent constipation after surgery.

## 2017-07-05 NOTE — Progress Notes (Signed)
Patient discharged home with infant. Discharge instructions, prescriptions and follow up appointment given to and reviewed with patient. Patient verbalized understanding. Patient wheeled out with infant by nursing students

## 2021-06-04 ENCOUNTER — Encounter: Payer: Self-pay | Admitting: Emergency Medicine

## 2021-06-04 ENCOUNTER — Emergency Department: Payer: No Typology Code available for payment source

## 2021-06-04 ENCOUNTER — Emergency Department
Admission: EM | Admit: 2021-06-04 | Discharge: 2021-06-04 | Disposition: A | Discharge status: Home / Self Care | Payer: No Typology Code available for payment source | Attending: Emergency Medicine | Admitting: Emergency Medicine

## 2021-06-04 ENCOUNTER — Other Ambulatory Visit: Payer: Self-pay

## 2021-06-04 DIAGNOSIS — F17210 Nicotine dependence, cigarettes, uncomplicated: Secondary | ICD-10-CM | POA: Insufficient documentation

## 2021-06-04 DIAGNOSIS — M542 Cervicalgia: Secondary | ICD-10-CM | POA: Insufficient documentation

## 2021-06-04 DIAGNOSIS — R519 Headache, unspecified: Secondary | ICD-10-CM | POA: Insufficient documentation

## 2021-06-04 DIAGNOSIS — S6992XA Unspecified injury of left wrist, hand and finger(s), initial encounter: Secondary | ICD-10-CM | POA: Diagnosis present

## 2021-06-04 DIAGNOSIS — Y92410 Unspecified street and highway as the place of occurrence of the external cause: Secondary | ICD-10-CM | POA: Insufficient documentation

## 2021-06-04 DIAGNOSIS — S60222A Contusion of left hand, initial encounter: Secondary | ICD-10-CM | POA: Insufficient documentation

## 2021-06-04 LAB — POC URINE PREG, ED: Preg Test, Ur: NEGATIVE

## 2021-06-04 MED ORDER — IBUPROFEN 600 MG PO TABS: 600.0000 mg | ORAL_TABLET | Freq: Four times a day (QID) | ORAL | 0 refills | Status: DC | PRN

## 2021-06-04 MED ORDER — IBUPROFEN 600 MG PO TABS
600.0000 mg | ORAL_TABLET | Freq: Once | ORAL | Status: DC
  Filled 2021-06-04: qty 1

## 2021-06-04 MED ORDER — IBUPROFEN 100 MG/5ML PO SUSP
600.0000 mg | Freq: Once | ORAL | Status: DC
  Filled 2021-06-04: qty 30

## 2021-06-04 MED ORDER — CYCLOBENZAPRINE HCL 10 MG PO TABS: 10.0000 mg | ORAL_TABLET | Freq: Three times a day (TID) | ORAL | 0 refills | Status: DC | PRN

## 2021-06-04 MED ORDER — CYCLOBENZAPRINE HCL 10 MG PO TABS
10.0000 mg | ORAL_TABLET | Freq: Once | ORAL | Status: DC
  Filled 2021-06-04: qty 1
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
## 2021-06-04 NOTE — ED Notes (Signed)
E-signature pad unavailable - Pt verbalized understanding of D/C information - no additional concerns at this time.  

## 2021-06-04 NOTE — ED Notes (Signed)
Pt back from CT

## 2021-06-04 NOTE — ED Notes (Signed)
Pt to CT

## 2021-06-04 NOTE — ED Notes (Signed)
Pt ambulatory from triage to the room.

## 2021-10-17 ENCOUNTER — Other Ambulatory Visit: Payer: Self-pay

## 2021-10-17 ENCOUNTER — Emergency Department: Payer: Medicaid Other

## 2021-10-17 ENCOUNTER — Emergency Department
Admission: EM | Admit: 2021-10-17 | Discharge: 2021-10-17 | Discharge status: Against Medical Advice | Payer: Medicaid Other | Attending: Emergency Medicine | Admitting: Emergency Medicine

## 2021-10-17 DIAGNOSIS — Z5329 Procedure and treatment not carried out because of patient's decision for other reasons: Secondary | ICD-10-CM | POA: Diagnosis not present

## 2021-10-17 DIAGNOSIS — R1013 Epigastric pain: Secondary | ICD-10-CM | POA: Insufficient documentation

## 2021-10-17 DIAGNOSIS — R110 Nausea: Secondary | ICD-10-CM | POA: Diagnosis not present

## 2021-10-17 DIAGNOSIS — R079 Chest pain, unspecified: Secondary | ICD-10-CM

## 2021-10-17 DIAGNOSIS — R0789 Other chest pain: Secondary | ICD-10-CM | POA: Insufficient documentation

## 2021-10-17 LAB — CBC
HCT: 38.3 % (ref 36.0–46.0)
Hemoglobin: 12.3 g/dL (ref 12.0–15.0)
MCH: 28.3 pg (ref 26.0–34.0)
MCHC: 32.1 g/dL (ref 30.0–36.0)
MCV: 88.2 fL (ref 80.0–100.0)
Platelets: 392 10*3/uL (ref 150–400)
RBC: 4.34 MIL/uL (ref 3.87–5.11)
RDW: 13.6 % (ref 11.5–15.5)
WBC: 11.7 10*3/uL — ABNORMAL HIGH (ref 4.0–10.5)
nRBC: 0 % (ref 0.0–0.2)

## 2021-10-17 LAB — BASIC METABOLIC PANEL
Anion gap: 5 (ref 5–15)
BUN: 13 mg/dL (ref 6–20)
CO2: 28 mmol/L (ref 22–32)
Calcium: 8.9 mg/dL (ref 8.9–10.3)
Chloride: 108 mmol/L (ref 98–111)
Creatinine, Ser: 0.57 mg/dL (ref 0.44–1.00)
GFR, Estimated: >60 mL/min (ref >60)
Glucose, Bld: 122 mg/dL — ABNORMAL HIGH (ref 70–99)
Potassium: 4.1 mmol/L (ref 3.5–5.1)
Sodium: 141 mmol/L (ref 135–145)

## 2021-10-17 LAB — HCG, QUANTITATIVE, PREGNANCY: hCG, Beta Chain, Quant, S: <1 m[IU]/mL (ref <5)

## 2021-10-17 LAB — HEPATIC FUNCTION PANEL
ALT: 11 U/L (ref 0–44)
AST: 15 U/L (ref 15–41)
Albumin: 3.9 g/dL (ref 3.5–5.0)
Alkaline Phosphatase: 39 U/L (ref 38–126)
Bilirubin, Direct: <0.1 mg/dL (ref 0.0–0.2)
Total Bilirubin: 0.2 mg/dL — ABNORMAL LOW (ref 0.3–1.2)
Total Protein: 7.1 g/dL (ref 6.5–8.1)

## 2021-10-17 LAB — TROPONIN I (HIGH SENSITIVITY): Troponin I (High Sensitivity): 2 ng/L (ref <18)

## 2021-10-17 LAB — LIPASE, BLOOD: Lipase: 44 U/L (ref 11–51)

## 2021-10-17 MED ORDER — FAMOTIDINE 20 MG PO TABS
20.0000 mg | ORAL_TABLET | Freq: Once | ORAL | Status: DC
  Filled 2021-10-17: qty 1

## 2021-10-17 MED ORDER — ALUM & MAG HYDROXIDE-SIMETH 200-200-20 MG/5ML PO SUSP
30.0000 mL | Freq: Once | ORAL | Status: DC
  Filled 2021-10-17: qty 30

## 2021-10-17 NOTE — ED Provider Notes (Signed)
? ?[facility] ?Provider Note ? ? ? Event Date/Time  ? First MD Initiated Contact with Patient 10/17/21 0518   ?  (approximate) ? ? ?History  ? ?Chest Pain ? ? ?HPI ? ?Tamara [name] is a 26 y.o. female with a history of anemia, marijuana abuse who presents for evaluation of chest pain.  Patient describes pain as dull/pressure located in the lower chest/epigastric region radiating straight into her back associated with nausea.  No vomiting, no diarrhea, no shortness of breath, no cough, no urinary symptoms, no fever or chills.  She has had normal bowel movements with no diarrhea or constipation.  LMP was a month ago.  No vaginal bleeding or vaginal discharge.  She reports that her symptoms have been constant for about 4 hours.  She denies pleuritic chest pain, personal or family history.  DVT, recent travel immobilization, leg pain or swelling, hemoptysis or exogenous hormones ?  ? ? ?Past Medical History:  ?Diagnosis Date  ? Anemia   ? Constipation during pregnancy   ? Late prenatal care   ? at 24 wks  ? Marijuana use   ? 2 wks ago from today 07/01/17  ? Medical history non-contributory   ? Noncompliance   ? Substance abuse (HCC)   ? cocaine, 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  ? UTI (urinary tract infection) during pregnancy   ? ? ?Past Surgical History:  ?Procedure Laterality Date  ? CESAREAN SECTION N/A 07/02/2017  ? Procedure: CESAREAN SECTION;  Surgeon: Schermerhorn, Ihor Austinhomas J, MD;  Location: [facility] ORS;  Service: Obstetrics;  Laterality: N/A;  ? NO PAST SURGERIES    ? ? ? ?Physical Exam  ? ?Triage Vital Signs: ?ED Triage Vitals  ?Enc Vitals Group  ?   BP 10/17/21 0504 (!) 118/95  ?   Pulse Rate 10/17/21 0504 86  ?   Resp 10/17/21 0504 16  ?   Temp 10/17/21 0504 98.6 ?F (37 ?C)  ?   Temp Source 10/17/21 0504 Oral  ?   SpO2 10/17/21 0504 100 %  ?   Weight 10/17/21 0506 118 lb (53.5 kg)  ?   Height 10/17/21 0506 4\' 10"  (1.473 m)  ?   Head Circumference --   ?   Peak Flow --   ?   Pain Score 10/17/21 0505 9  ?    Pain Loc --   ?   Pain Edu? --   ?   Excl. in GC? --   ? ? ?Most recent vital signs: ?Vitals:  ? 10/17/21 0504  ?BP: (!) 118/95  ?Pulse: 86  ?Resp: 16  ?Temp: 98.6 ?F (37 ?C)  ?SpO2: 100%  ? ? ? ?Constitutional: Alert and oriented. Well appearing and in no apparent distress. ?HEENT: ?     Head: Normocephalic and atraumatic.    ?     Eyes: Conjunctivae are normal. Sclera is non-icteric.  ?     Mouth/Throat: Mucous membranes are moist.  ?     Neck: Supple with no signs of meningismus. ?Cardiovascular: Regular rate and rhythm. No murmurs, gallops, or rubs. 2+ symmetrical distal pulses are present in all extremities.  ?Respiratory: Normal respiratory effort. Lungs are clear to auscultation bilaterally.  ?Gastrointestinal: Soft, non tender, and non distended with positive bowel sounds. No rebound or guarding. ?Genitourinary: No CVA tenderness. ?Musculoskeletal:  No edema, cyanosis, or erythema of extremities. ?Neurologic: Normal speech and language. Face is symmetric. Moving all extremities. No gross focal neurologic deficits are appreciated. ?Skin: Skin is warm,  dry and intact. No rash noted. ?Psychiatric: Mood and affect are normal. Speech and behavior are normal. ? ?ED Results / Procedures / Treatments  ? ?Labs ?(all labs ordered are listed, but only abnormal results are displayed) ?Labs Reviewed  ?BASIC METABOLIC PANEL - Abnormal; Notable for the following components:  ?    Result Value  ? Glucose, Bld 122 (*)   ? All other components within normal limits  ?CBC - Abnormal; Notable for the following components:  ? WBC 11.7 (*)   ? All other components within normal limits  ?HEPATIC FUNCTION PANEL - Abnormal; Notable for the following components:  ? Total Bilirubin 0.2 (*)   ? All other components within normal limits  ?HCG, QUANTITATIVE, PREGNANCY  ?LIPASE, BLOOD  ?POC URINE PREG, ED  ?TROPONIN I (HIGH SENSITIVITY)  ?TROPONIN I (HIGH SENSITIVITY)  ? ? ? ?EKG ? ?ED ECG REPORT ?I, Nita Sickle, the attending  physician, personally viewed and interpreted this ECG. ? ?Normal sinus rhythm with a rate of 74, normal intervals, normal axis, no ST elevations or depressions. ? ?RADIOLOGY ?I, Nita Sickle, attending MD, have personally viewed and interpreted the images obtained during this visit as below: ? ?Chest x-ray negative ? ? ?___________________________________________________ ?Interpretation by Radiologist:  ?DG Chest 2 View ? ?Result Date: 10/17/2021 ?CLINICAL DATA:  Chest pain. EXAM: CHEST - 2 VIEW COMPARISON:  None. FINDINGS: The heart size and mediastinal contours are within normal limits. Both lungs are clear. The visualized skeletal structures are unremarkable. IMPRESSION: No active cardiopulmonary disease. Electronically Signed   By: Kennith Center M.D.   On: 10/17/2021 06:47   ? ? ? ?PROCEDURES: ? ?Critical Care performed: No ? ?Procedures ? ? ? ?IMPRESSION / MDM / ASSESSMENT AND PLAN / ED COURSE  ?I reviewed the triage vital signs and the nursing notes. ? ?26 y.o. female with a history of anemia, marijuana abuse who presents for evaluation of chest pain.  Patient with dull constant epigastric/lower chest abdominal pain radiating to the back and nausea.  On exam she is well-appearing in no distress with normal vital signs.  Abdomen is soft with no tenderness throughout.  Heart regular rate and rhythm, lungs are clear to auscultation. ? ?Ddx: Indigestion versus GERD versus pancreatitis versus gallbladder pathology versus peptic ulcer disease versus esophageal spasms. patient is PERC negative ? ? ?Plan: EKG, trop x 2, CXR, CBC, CMP, lipase, hCG. Maalox and pepcid.  Patient placed on telemetry for close monitoring of cardiorespiratory status. ? ? ?MEDICATIONS GIVEN IN ED: ?Medications  ?alum & mag hydroxide-simeth (MAALOX/MYLANTA) 200-200-20 MG/5ML suspension 30 mL (30 mLs Oral Not Given 10/17/21 0545)  ?famotidine (PEPCID) tablet 20 mg (20 mg Oral Not Given 10/17/21 0545)  ? ? ?ED COURSE: Nurse went in the room 20  minutes after was done seen patient and patient had eloped.  Patient is EKG shows no acute ischemic changes.  Chest x-ray is negative.  Labs showing normal LFTs and lipase.  Troponin negative.  Negative pregnancy test.  No CBC and BMP with no acute findings.  Patient eloped and unfortunately second troponin cannot be done.  She did not receive any of the medications prescribed as well. ? ? ?Consults: none ? ? ? ?EMR reviewed including patient's last admission to the hospital in January 2019 for constipation ? ? ? ?FINAL CLINICAL IMPRESSION(S) / ED DIAGNOSES  ? ?Final diagnoses:  ?Chest pain, unspecified type  ? ? ? ?Rx / DC Orders  ? ?ED Discharge Orders   ? ?  None  ? ?  ? ? ? ?Note:  This document was prepared using Dragon voice recognition software and may include unintentional dictation errors. ? ? ?Please note:  Patient was evaluated in Emergency Department today for the symptoms described in the history of present illness. Patient was evaluated in the context of the global COVID-19 pandemic, which necessitated consideration that the patient might be at risk for infection with the SARS-CoV-2 virus that causes COVID-19. Institutional protocols and algorithms that pertain to the evaluation of patients at risk for COVID-19 are in a state of rapid change based on information released by regulatory bodies including the CDC and federal and state organizations. These policies and algorithms were followed during the patient's care in the ED.  Some ED evaluations and interventions may be delayed as a result of limited staffing during the pandemic. ? ? ? ? ?  ?Nita Sickle, MD ?10/17/21 [phone] ? ?

## 2021-10-17 NOTE — ED Triage Notes (Signed)
Pt presents to ER c/o chest pain that started suddenly appx 4 hours ago.  Pt states pain radiates into her mid back.  Pt denies hx of acid reflux. Denies n/v, sob or dizziness.  No cardiac hx per pt.  Pt A&O x4 at this time in NAD.   ?

## 2022-07-13 ENCOUNTER — Other Ambulatory Visit: Payer: Self-pay

## 2022-07-13 ENCOUNTER — Emergency Department: Payer: Medicaid Other

## 2022-07-13 ENCOUNTER — Emergency Department
Admission: EM | Admit: 2022-07-13 | Discharge: 2022-07-13 | Disposition: A | Discharge status: Home / Self Care | Payer: Medicaid Other | Attending: Emergency Medicine | Admitting: Emergency Medicine

## 2022-07-13 DIAGNOSIS — R079 Chest pain, unspecified: Secondary | ICD-10-CM | POA: Diagnosis present

## 2022-07-13 DIAGNOSIS — Z1152 Encounter for screening for COVID-19: Secondary | ICD-10-CM | POA: Diagnosis not present

## 2022-07-13 DIAGNOSIS — K209 Esophagitis, unspecified without bleeding: Secondary | ICD-10-CM | POA: Insufficient documentation

## 2022-07-13 LAB — BASIC METABOLIC PANEL
Anion gap: 8 (ref 5–15)
BUN: 14 mg/dL (ref 6–20)
CO2: 27 mmol/L (ref 22–32)
Calcium: 9.6 mg/dL (ref 8.9–10.3)
Chloride: 104 mmol/L (ref 98–111)
Creatinine, Ser: 0.61 mg/dL (ref 0.44–1.00)
GFR, Estimated: >60 mL/min (ref >60)
Glucose, Bld: 95 mg/dL (ref 70–99)
Potassium: 3.6 mmol/L (ref 3.5–5.1)
Sodium: 139 mmol/L (ref 135–145)

## 2022-07-13 LAB — RESP PANEL BY RT-PCR (RSV, FLU A&B, COVID)  RVPGX2
Influenza A by PCR: NEGATIVE
Influenza B by PCR: NEGATIVE
Resp Syncytial Virus by PCR: NEGATIVE
SARS Coronavirus 2 by RT PCR: NEGATIVE

## 2022-07-13 LAB — CBC
HCT: 36.4 % (ref 36.0–46.0)
Hemoglobin: 12.1 g/dL (ref 12.0–15.0)
MCH: 29 pg (ref 26.0–34.0)
MCHC: 33.2 g/dL (ref 30.0–36.0)
MCV: 87.3 fL (ref 80.0–100.0)
Platelets: 365 10*3/uL (ref 150–400)
RBC: 4.17 MIL/uL (ref 3.87–5.11)
RDW: 13.2 % (ref 11.5–15.5)
WBC: 9.8 10*3/uL (ref 4.0–10.5)
nRBC: 0 % (ref 0.0–0.2)

## 2022-07-13 LAB — TROPONIN I (HIGH SENSITIVITY)
Troponin I (High Sensitivity): <2 ng/L (ref <18)
Troponin I (High Sensitivity): <2 ng/L (ref <18)

## 2022-07-13 MED ORDER — SUCRALFATE 1 GM/10ML PO SUSP: 1.0000 g | Freq: Four times a day (QID) | ORAL | 1 refills | Status: DC

## 2022-07-13 MED ORDER — LIDOCAINE VISCOUS HCL 2 % MT SOLN
15.0000 mL | Freq: Once | OROMUCOSAL | Status: AC
  Administered 2022-07-13: 15 mL via ORAL
  Filled 2022-07-13: qty 15

## 2022-07-13 MED ORDER — ALUM & MAG HYDROXIDE-SIMETH 200-200-20 MG/5ML PO SUSP
30.0000 mL | Freq: Once | ORAL | Status: AC
Start: 2022-07-13 — End: 2022-07-13
  Administered 2022-07-13: 30 mL via ORAL
  Filled 2022-07-13: qty 30

## 2022-07-13 MED ORDER — PANTOPRAZOLE SODIUM 40 MG PO PACK: 20.0000 mg | PACK | Freq: Every day | ORAL | 2 refills | Status: DC

## 2022-07-13 NOTE — ED Provider Notes (Signed)
[facility] Provider Note    Event Date/Time   First MD Initiated Contact with Patient 07/13/22 [phone]     (approximate)   History   Chest Pain   HPI  Tamara [name] is a 27 y.o. female  who presents to the emergency department today because of concern for chest pain. Located in the central lower chest. It does radiate to her back. The patient states that it started about a week ago. Has been constant since then. Slightly worse when she is trying to sleep. She denies any other factors that make the pain worse. Has had one episode of associated vomiting. The patient denies any fevers or chills. Has had similar pain in the past although it would typically only last for an hour or two. Tried pepto bismal and aspirin that gave some relief initially.       Physical Exam   Triage Vital Signs: ED Triage Vitals  Enc Vitals Group     BP 07/13/22 0409 136/84     Pulse Rate 07/13/22 0409 67     Resp 07/13/22 0409 18     Temp 07/13/22 0409 98.3 F (36.8 C)     Temp Source 07/13/22 0631 Oral     SpO2 07/13/22 0409 99 %     Weight 07/13/22 0410 119 lb (54 kg)     Height 07/13/22 0410 4\' 10"  (1.473 m)     Head Circumference --      Peak Flow --      Pain Score 07/13/22 0409 8     Pain Loc --      Pain Edu? --      Excl. in [place]? --     Most recent vital signs: Vitals:   07/13/22 0631 07/13/22 0644  BP: 126/77 122/76  Pulse: (!) 57 79  Resp: 18 18  Temp: 98.8 F (37.1 C) 98.3 F (36.8 C)  SpO2: 97% 99%   General: Awake, alert, oriented. CV:  Good peripheral perfusion. Regular rate and rhythm Resp:  Normal effort. Lungs clear. Abd:  No distention. Non tender.   ED Results / Procedures / Treatments   Labs (all labs ordered are listed, but only abnormal results are displayed) Labs Reviewed  BASIC METABOLIC PANEL  CBC  POC URINE PREG, ED  TROPONIN I (HIGH SENSITIVITY)  TROPONIN I (HIGH SENSITIVITY)     EKG  I, Nance Pear, attending  physician, personally viewed and interpreted this EKG  EKG Time: 0409 Rate: 73 Rhythm: normal sinus rhythm Axis: normal Intervals: qtc 438 QRS: narrow ST changes: no st elevation Impression: normal ekg   RADIOLOGY I independently interpreted and visualized the CXR. My interpretation: No pneumonia. Radiology interpretation:  IMPRESSION:  No radiographic evidence of acute cardiopulmonary disease.      PROCEDURES:  Critical Care performed: No  Procedures   MEDICATIONS ORDERED IN ED: Medications - No data to display   IMPRESSION / MDM / [place] / ED COURSE  I reviewed the triage vital signs and the nursing notes.                              Differential diagnosis includes, but is not limited to, acs, pneumonia, pneumothorax, pe, gerd  Patient's presentation is most consistent with acute presentation with potential threat to life or bodily function.   Patient presented to the emergency department today because of concern for chest pain. The patient's EKG  and cxr without concerning abnormality. Blood work without concerning leukocytosis or elevated troponin. Doubt PE at this time. Doubt dissection. Patient felt significant improvement after GI cocktail, think esophagitis likely at this time. Will plan on discharging with prescription for medications to help with symptoms. Will give dietary guidelines.      FINAL CLINICAL IMPRESSION(S) / ED DIAGNOSES   Final diagnoses:  Esophagitis     Note:  This document was prepared using Dragon voice recognition software and may include unintentional dictation errors.    Nance Pear, MD 07/13/22 [phone]

## 2022-07-13 NOTE — Discharge Instructions (Signed)
Please seek medical attention for any high fevers, chest pain, shortness of breath, change in behavior, persistent vomiting, bloody stool or any other new or concerning symptoms.  

## 2022-07-13 NOTE — ED Notes (Signed)
Pt. States pain much improved after GI cocktail. Provider notified.

## 2022-07-13 NOTE — ED Triage Notes (Signed)
Pt comes from home via POV c/o chest pain. Pt states she's been having pain in center of chest for a week that radiates to her back but has not gotten better. Pt describes pain as constant and like its sore. Pt also endorses SOB with exertion that started a week ago too. Pt in NAD.

## 2022-12-29 ENCOUNTER — Ambulatory Visit: Payer: Medicaid Other | Admitting: Nurse Practitioner

## 2022-12-29 NOTE — Progress Notes (Deleted)
   There were no vitals taken for this visit.   Subjective:    Patient ID: Tamara [name], female    DOB: June 12, 1996, 27 y.o.   MRN: [id number]  HPI: Tamara [name] is a 27 y.o. female  No chief complaint on file.  Establish care: her last physical was ***.  Medical history includes ***.  Family history includes ***.  Health maintenance ***.   Relevant past medical, surgical, family and social history reviewed and updated as indicated. Interim medical history since our last visit reviewed. Allergies and medications reviewed and updated.  Review of Systems  Constitutional: Negative for fever or weight change.  Respiratory: Negative for cough and shortness of breath.   Cardiovascular: Negative for chest pain or palpitations.  Gastrointestinal: Negative for abdominal pain, no bowel changes.  Musculoskeletal: Negative for gait problem or joint swelling.  Skin: Negative for rash.  Neurological: Negative for dizziness or headache.  No other specific complaints in a complete review of systems (except as listed in HPI above).      Objective:    There were no vitals taken for this visit.  Wt Readings from Last 3 Encounters:  07/13/22 119 lb (54 kg)  10/17/21 118 lb (53.5 kg)  06/04/21 116 lb (52.6 kg)    Physical Exam  Constitutional: Patient appears well-developed and well-nourished. Obese *** No distress.  HEENT: head atraumatic, normocephalic, pupils equal and reactive to light, ears ***, neck supple, throat within normal limits Cardiovascular: Normal rate, regular rhythm and normal heart sounds.  No murmur heard. No BLE edema. Pulmonary/Chest: Effort normal and breath sounds normal. No respiratory distress. Abdominal: Soft.  There is no tenderness. Psychiatric: Patient has a normal mood and affect. behavior is normal. Judgment and thought content normal.      Assessment & Plan:   Problem List Items Addressed This Visit   None    Follow up plan: No follow-ups on  file.

## 2023-08-17 ENCOUNTER — Ambulatory Visit (LOCAL_COMMUNITY_HEALTH_CENTER): Payer: Medicaid Other

## 2023-08-17 VITALS — BP 126/58 | Ht 60.0 in | Wt 128.0 lb

## 2023-08-17 DIAGNOSIS — Z308 Encounter for other contraceptive management: Secondary | ICD-10-CM | POA: Diagnosis not present

## 2023-08-17 DIAGNOSIS — Z3201 Encounter for pregnancy test, result positive: Secondary | ICD-10-CM

## 2023-08-17 LAB — PREGNANCY, URINE: Preg Test, Ur: POSITIVE — AB

## 2023-08-17 MED ORDER — PRENATAL VITAMINS 28-0.8 MG PO TABS: 28.0000 mg | ORAL_TABLET | Freq: Every day | ORAL | Status: AC

## 2023-08-17 NOTE — Progress Notes (Signed)
 UPT positive.  Thinks she may want to receive care at [facility].  Gave positive pregnancy packet and reviewed with her.   See flowsheet.  States she smokes 1/2 pack cigarettes per day and states would like to quit smoking.  Quitline [place] contact card, [employer] of [employer] pamphlet, and second hand smoking pamphlet given to pt.  The patient was dispensed prenatal vitamins today. I provided counseling today regarding the medication.  Patient given the opportunity to ask questions. Questions answered.    Walked pt to clerk for presumptive eligibility and to schedule prenatal appt when 10-[redacted] weeks pregnant.  Cherlynn Polo, RN

## 2023-09-04 ENCOUNTER — Telehealth (INDEPENDENT_AMBULATORY_CARE_PROVIDER_SITE_OTHER)

## 2023-09-04 VITALS — Ht 60.0 in | Wt 128.0 lb

## 2023-09-04 DIAGNOSIS — Z3481 Encounter for supervision of other normal pregnancy, first trimester: Secondary | ICD-10-CM

## 2023-09-04 DIAGNOSIS — Z3689 Encounter for other specified antenatal screening: Secondary | ICD-10-CM

## 2023-09-04 DIAGNOSIS — Z3490 Encounter for supervision of normal pregnancy, unspecified, unspecified trimester: Secondary | ICD-10-CM | POA: Insufficient documentation

## 2023-09-04 DIAGNOSIS — Z3A09 9 weeks gestation of pregnancy: Secondary | ICD-10-CM

## 2023-09-04 DIAGNOSIS — O3680X0 Pregnancy with inconclusive fetal viability, not applicable or unspecified: Secondary | ICD-10-CM

## 2023-09-04 DIAGNOSIS — Z98891 History of uterine scar from previous surgery: Secondary | ICD-10-CM

## 2023-09-04 NOTE — Patient Instructions (Signed)
 Prenatal Classes offered through [facility] [email or web address]   Doula Website [email or web address]  First Trimester of Pregnancy  The first trimester of pregnancy starts on the first day of your last monthly period until the end of week 13. This is months 1 through 3 of pregnancy. A week after a sperm fertilizes an egg, the egg will implant into the wall of the uterus and begin to develop into a baby. Body changes during your first trimester Your body goes through many changes during pregnancy. The changes usually return to normal after your baby is born. Physical changes Your breasts may grow larger and may hurt. The area around your nipples may get darker. Your periods will stop. Your hair and nails may grow faster. You may pee more often. Health changes You may tire easily. Your gums may bleed and may be sensitive when you brush and floss. You may not feel hungry. You may have heartburn. You may throw up or feel like you may throw up. You may want to eat some foods, but not others. You may have headaches. You may have trouble pooping (constipation). Other changes Your emotions may change from day to day. You may have more dreams. Follow these instructions at home: Medicines Talk to your health care provider if you're taking medicines. Ask if the medicines are safe to take during pregnancy. Your provider may change the medicines that you take. Do not take any medicines unless told to by your provider. Take a prenatal vitamin that has at least 600 micrograms (mcg) of folic acid. Do not use herbal medicines, illegal substances, or medicines that are not approved by your provider. Eating and drinking While you're pregnant your body needs extra food for your growing baby. Talk with your provider about what to eat while pregnant. Activity Most women are able  to exercise during pregnancy. Exercises may need to change as your pregnancy goes on. Talk to your provider about your activities and exercise routines. Relieving pain and discomfort Wear a good, supportive bra if your breasts hurt. Rest with your legs raised if you have leg cramps or low back pain. Safety Wear your seatbelt at all times when you're in a car. Talk to your provider if someone hits you, hurts you, or yells at you. Talk with your provider if you're feeling sad or have thoughts of hurting yourself. Lifestyle Certain things can be harmful while you're pregnant. Follow these rules: Do not use hot tubs, steam rooms, or saunas. Do not douche. Do not use tampons or scented pads. Do not drink alcohol,smoke, vape, or use products with nicotine or tobacco in them. If you need help quitting, talk with your provider. Avoid cat litter boxes and soil used by cats. These things carry germs that can cause harm to your pregnancy and your baby. General instructions Keep all follow-up visits. It helps you and your unborn baby stay as healthy as possible. Write down your questions. Take them to your visits. Your provider will: Talk with you about your overall health. Give you advice or refer you to specialists who can help with different needs, including: Prenatal education classes. Mental health and counseling. Foods and healthy eating. Ask for help if you need help with food. Call your dentist and ask to be seen. Brush your teeth with a soft toothbrush. Floss gently. Where to find more information American Pregnancy Association: americanpregnancy.org [employer]: acog.org Office on [employer] Health: TravelLesson.ca Contact a health care provider if: You feel dizzy, faint, or  have a fever. You vomit or have watery poop (diarrhea) for 2 days or more. You have abnormal discharge or bleeding from your vagina. You have pain when you pee or your pee smells  bad. You have cramps, pain, or pressure in your belly area. Get help right away if: You have trouble breathing or chest pain. You have any kind of injury, such as from a fall or a car crash. These symptoms may be an emergency. Get help right away. Call 911. Do not wait to see if the symptoms will go away. Do not drive yourself to the hospital. This information is not intended to replace advice given to you by your health care provider. Make sure you discuss any questions you have with your health care provider. Document Revised: 03/09/2023 Document Reviewed: 10/07/2022 Elsevier Patient Education  2024 Elsevier Inc.Commonly Asked Questions During Pregnancy  Cats: A parasite can be excreted in cat feces.  To avoid exposure you need to have another person empty the little box.  If you must empty the litter box you will need to wear gloves.  Wash your hands after handling your cat.  This parasite can also be found in raw or undercooked meat so this should also be avoided.  Colds, Sore Throats, Flu: Please check your medication sheet to see what you can take for symptoms.  If your symptoms are unrelieved by these medications please call the office.  Dental Work: Most any dental work Agricultural consultant recommends is permitted.  X-rays should only be taken during the first trimester if absolutely necessary.  Your abdomen should be shielded with a lead apron during all x-rays.  Please notify your provider prior to receiving any x-rays.  Novocaine is fine; gas is not recommended.  If your dentist requires a note from [place] prior to dental work please call the office and we will provide one for you.  Exercise: Exercise is an important part of staying healthy during your pregnancy.  You may continue most exercises you were accustomed to prior to pregnancy.  Later in your pregnancy you will most likely notice you have difficulty with activities requiring balance like riding a bicycle.  It is important that you listen  to your body and avoid activities that put you at a higher risk of falling.  Adequate rest and staying well hydrated are a must!  If you have questions about the safety of specific activities ask your provider.    Exposure to Children with illness: Try to avoid obvious exposure; report any symptoms to [place] when noted,  If you have chicken pos, red measles or mumps, you should be immune to these diseases.   Please do not take any vaccines while pregnant unless you have checked with your OB provider.  Fetal Movement: After 28 weeks we recommend you do "kick counts" twice daily.  Lie or sit down in a calm quiet environment and count your baby movements "kicks".  You should feel your baby at least 10 times per hour.  If you have not felt 10 kicks within the first hour get up, walk around and have something sweet to eat or drink then repeat for an additional hour.  If count remains less than 10 per hour notify your provider.  Fumigating: Follow your pest control agent's advice as to how long to stay out of your home.  Ventilate the area well before re-entering.  Hemorrhoids:   Most over-the-counter preparations can be used during pregnancy.  Check your medication to see  what is safe to use.  It is important to use a stool softener or fiber in your diet and to drink lots of liquids.  If hemorrhoids seem to be getting worse please call the office.   Hot Tubs:  Hot tubs Jacuzzis and saunas are not recommended while pregnant.  These increase your internal body temperature and should be avoided.  Intercourse:  Sexual intercourse is safe during pregnancy as long as you are comfortable, unless otherwise advised by your provider.  Spotting may occur after intercourse; report any bright red bleeding that is heavier than spotting.  Labor:  If you know that you are in labor, please go to the hospital.  If you are unsure, please call the office and let us help you decide what to do.  Lifting, straining, etc:  If your  job requires heavy lifting or straining please check with your provider for any limitations.  Generally, you should not lift items heavier than that you can lift simply with your hands and arms (no back muscles)  Painting:  Paint fumes do not harm your pregnancy, but may make you ill and should be avoided if possible.  Latex or water based paints have less odor than oils.  Use adequate ventilation while painting.  Permanents & Hair Color:  Chemicals in hair dyes are not recommended as they cause increase hair dryness which can increase hair loss during pregnancy.  " Highlighting" and permanents are allowed.  Dye may be absorbed differently and permanents may not hold as well during pregnancy.  Sunbathing:  Use a sunscreen, as skin burns easily during pregnancy.  Drink plenty of fluids; avoid over heating.  Tanning Beds:  Because their possible side effects are still unknown, tanning beds are not recommended.  Ultrasound Scans:  Routine ultrasounds are performed at approximately 20 weeks.  You will be able to see your baby's general anatomy an if you would like to know the gender this can usually be determined as well.  If it is questionable when you conceived you may also receive an ultrasound early in your pregnancy for dating purposes.  Otherwise ultrasound exams are not routinely performed unless there is a medical necessity.  Although you can request a scan we ask that you pay for it when conducted because insurance does not cover " patient request" scans.  Work: If your pregnancy proceeds without complications you may work until your due date, unless your physician or employer advises otherwise.  Round Ligament Pain/Pelvic Discomfort:  Sharp, shooting pains not associated with bleeding are fairly common, usually occurring in the second trimester of pregnancy.  They tend to be worse when standing up or when you remain standing for long periods of time.  These are the result of pressure of certain  pelvic ligaments called "round ligaments".  Rest, Tylenol and heat seem to be the most effective relief.  As the womb and fetus grow, they rise out of the pelvis and the discomfort improves.  Please notify the office if your pain seems different than that described.  It may represent a more serious condition.  Common Medications Safe in Pregnancy  Acne:      Constipation:  Benzoyl Peroxide     Colace  Clindamycin      Dulcolax Suppository  Topica Erythromycin     Fibercon  Salicylic Acid      Metamucil         Miralax AVOID:        Senakot   Accutane  Cough:  Retin-A       Cough Drops  Tetracycline      Phenergan w/ Codeine if Rx  Minocycline      Robitussin (Plain & DM)  Antibiotics:     Crabs/Lice:  Ceclor       RID  Cephalosporins    AVOID:  E-Mycins      Kwell  Keflex  Macrobid/Macrodantin   Diarrhea:  Penicillin      Kao-Pectate  Zithromax      Imodium AD         PUSH FLUIDS AVOID:       Cipro     Fever:  Tetracycline      Tylenol (Regular or Extra  Minocycline       Strength)  Levaquin      Extra Strength-Do not          Exceed 8 tabs/24 hrs Caffeine:        200mg /day (equiv. To 1 cup of coffee or  approx. 3 12 oz sodas)         Gas: Cold/Hayfever:       Gas-X  Benadryl      Mylicon  Claritin       Phazyme  **Claritin-D        Chlor-Trimeton    Headaches:  Dimetapp      ASA-Free Excedrin  Drixoral-Non-Drowsy     Cold Compress  Mucinex (Guaifenasin)     Tylenol (Regular or Extra  Sudafed/Sudafed-12 Hour     Strength)  **Sudafed PE Pseudoephedrine   Tylenol Cold & Sinus     Vicks Vapor Rub  Zyrtec  **AVOID if Problems With Blood Pressure         Heartburn: Avoid lying down for at least 1 hour after meals  Aciphex      Maalox     Rash:  Milk of Magnesia     Benadryl    Mylanta       1% Hydrocortisone Cream  Pepcid  Pepcid Complete   Sleep Aids:  Prevacid      Ambien   Prilosec       Benadryl  Rolaids       Chamomile Tea  Tums (Limit  4/day)     Unisom         Tylenol PM         Warm milk-add vanilla or  Hemorrhoids:       Sugar for taste  Anusol/Anusol H.C.  (RX: Analapram 2.5%)  Sugar Substitutes:  Hydrocortisone OTC     Ok in moderation  Preparation H      Tucks        Vaseline lotion applied to tissue with wiping    Herpes:     Throat:  Acyclovir      Oragel  Famvir  Valtrex     Vaccines:         Flu Shot Leg Cramps:       *Gardasil  Benadryl      Hepatitis A         Hepatitis B Nasal Spray:       Pneumovax  Saline Nasal Spray     Polio Booster         Tetanus Nausea:       Tuberculosis test or PPD  Vitamin B6 25 mg TID   AVOID:    Dramamine      *Gardasil  Emetrol       Live Poliovirus  Ginger Root 250 mg  QID    MMR (measles, mumps &  High Complex Carbs @ Bedtime    rebella)  Sea Bands-Accupressure    Varicella (Chickenpox)  Unisom 1/2 tab TID     *No known complications           If received before Pain:         Known pregnancy;   Darvocet       Resume series after  Lortab        Delivery  Percocet    Yeast:   Tramadol      Femstat  Tylenol 3      Gyne-lotrimin  Ultram       Monistat  Vicodin           MISC:         All Sunscreens           Hair Coloring/highlights          Insect Repellant's          (Including DEET)         Mystic Tans

## 2023-09-04 NOTE — Progress Notes (Signed)
 New OB Intake  I connected with  Tamara [name] on 09/04/23 at  9:15 AM EDT by telephone Video Visit and verified that I am speaking with the correct person using two identifiers. Nurse is located at [facility] and pt is located in her car.  I discussed the limitations, risks, security and privacy concerns of performing an evaluation and management service by telephone and the availability of in person appointments. I also discussed with the patient that there may be a patient responsible charge related to this service. The patient expressed understanding and agreed to proceed.  I explained I am completing New OB Intake today. We discussed her EDD of 04/08/24 that is based on LMP of 07/03/23. Pt is G2/P1001. I reviewed her allergies, medications, Medical/Surgical/OB history, and appropriate screenings. There are cats in the home in the home; yes,Indoor but patient reports she is not involved in cleaning them.  Based on history, this is a/an pregnancy uncomplicated . Her obstetrical history is significant for none.  Patient Active Problem List   Diagnosis Date Noted   Supervision of low-risk pregnancy 09/04/2023   Personal history of previous postdates pregnancy 07/02/2017   Postoperative state 07/02/2017   No prenatal care in current pregnancy in third trimester 06/28/2017    Concerns addressed today  Delivery Plans:  Plans to deliver at [facility]  Dating [place] Explained first scheduled [place] will be around 9-10 weeks. Dating [place] scheduled for 09/13/23 at 4pm. Pt notified to arrive at 3:45.  Labs Discussed genetic screening with patient. Patient yes genetic testing to be drawn at new OB visit. Discussed possible labs to be drawn at new OB appointment.  COVID Vaccine Patient has not had COVID vaccine.   Social Determinants of Health Food Insecurity: denies food insecurity WIC Referral: Patient is interested in referral to [facility].  Transportation: Patient denies  transportation needs. Childcare: Discussed no children allowed at ultrasound appointments.   First visit review I reviewed new OB appt with pt. I explained she will have ob bloodwork and pap smear/pelvic exam if indicated. Explained pt will be seen by Hartley Barefoot, CNM at first visit; encounter routed to appropriate provider.   Loman Chroman, CMA 09/04/2023  9:24 AM

## 2023-09-13 ENCOUNTER — Telehealth: Payer: Self-pay | Admitting: Certified Nurse Midwife

## 2023-09-13 ENCOUNTER — Other Ambulatory Visit

## 2023-09-13 NOTE — Telephone Encounter (Signed)
 Reached out to pt about dating scan that was scheduled on 09/13/2023 at 4:00.  Needed to give number for Centralized Scheduling.  Could not leave a message bc mailbox was full.

## 2023-09-13 NOTE — Addendum Note (Signed)
 Addended by: Loman Chroman on: 09/13/2023 04:33 PM   Modules accepted: Orders

## 2023-09-14 ENCOUNTER — Encounter: Payer: Self-pay | Admitting: Certified Nurse Midwife

## 2023-09-14 NOTE — Telephone Encounter (Signed)
 Reached out to pt (2x) about dating scan that was scheduled on 09/13/2023 at 4:00.  Needed to give number for Centralized Scheduling.  Could not leave a message bc mailbox was full.  Will send a MyChart letter to pt.

## 2023-09-20 ENCOUNTER — Ambulatory Visit
Admission: RE | Admit: 2023-09-20 | Discharge: 2023-09-20 | Disposition: A | Discharge status: Home / Self Care | Source: Ambulatory Visit | Attending: Certified Nurse Midwife | Admitting: Certified Nurse Midwife

## 2023-09-20 ENCOUNTER — Other Ambulatory Visit: Payer: Self-pay | Admitting: Certified Nurse Midwife

## 2023-09-20 DIAGNOSIS — Z3481 Encounter for supervision of other normal pregnancy, first trimester: Secondary | ICD-10-CM

## 2023-09-20 DIAGNOSIS — Z3687 Encounter for antenatal screening for uncertain dates: Secondary | ICD-10-CM | POA: Insufficient documentation

## 2023-09-20 DIAGNOSIS — Z3A09 9 weeks gestation of pregnancy: Secondary | ICD-10-CM

## 2023-09-20 DIAGNOSIS — Z3A11 11 weeks gestation of pregnancy: Secondary | ICD-10-CM | POA: Insufficient documentation

## 2023-09-20 DIAGNOSIS — O3680X0 Pregnancy with inconclusive fetal viability, not applicable or unspecified: Secondary | ICD-10-CM

## 2023-09-20 DIAGNOSIS — Z3490 Encounter for supervision of normal pregnancy, unspecified, unspecified trimester: Secondary | ICD-10-CM

## 2023-09-20 DIAGNOSIS — Z98891 History of uterine scar from previous surgery: Secondary | ICD-10-CM

## 2023-10-02 ENCOUNTER — Encounter: Payer: Self-pay | Admitting: Certified Nurse Midwife

## 2023-10-02 ENCOUNTER — Ambulatory Visit (INDEPENDENT_AMBULATORY_CARE_PROVIDER_SITE_OTHER): Admitting: Certified Nurse Midwife

## 2023-10-02 ENCOUNTER — Telehealth: Payer: Self-pay | Admitting: Certified Nurse Midwife

## 2023-10-02 ENCOUNTER — Other Ambulatory Visit (HOSPITAL_COMMUNITY)
Admission: RE | Admit: 2023-10-02 | Discharge: 2023-10-02 | Disposition: A | Discharge status: Home / Self Care | Source: Ambulatory Visit | Attending: Certified Nurse Midwife | Admitting: Certified Nurse Midwife

## 2023-10-02 VITALS — BP 117/66 | HR 85 | Wt 133.0 lb

## 2023-10-02 DIAGNOSIS — Z3492 Encounter for supervision of normal pregnancy, unspecified, second trimester: Secondary | ICD-10-CM | POA: Diagnosis present

## 2023-10-02 DIAGNOSIS — Z3A13 13 weeks gestation of pregnancy: Secondary | ICD-10-CM

## 2023-10-02 DIAGNOSIS — Z369 Encounter for antenatal screening, unspecified: Secondary | ICD-10-CM

## 2023-10-02 DIAGNOSIS — Z113 Encounter for screening for infections with a predominantly sexual mode of transmission: Secondary | ICD-10-CM

## 2023-10-02 DIAGNOSIS — O34219 Maternal care for unspecified type scar from previous cesarean delivery: Secondary | ICD-10-CM | POA: Insufficient documentation

## 2023-10-02 DIAGNOSIS — O99332 Smoking (tobacco) complicating pregnancy, second trimester: Secondary | ICD-10-CM

## 2023-10-02 DIAGNOSIS — Z1379 Encounter for other screening for genetic and chromosomal anomalies: Secondary | ICD-10-CM

## 2023-10-02 DIAGNOSIS — Z3491 Encounter for supervision of normal pregnancy, unspecified, first trimester: Secondary | ICD-10-CM | POA: Diagnosis not present

## 2023-10-02 DIAGNOSIS — Z114 Encounter for screening for human immunodeficiency virus [HIV]: Secondary | ICD-10-CM

## 2023-10-02 MED ORDER — NICOTINE POLACRILEX 2 MG MT GUM: 2.0000 mg | CHEWING_GUM | OROMUCOSAL | 1 refills | Status: AC | PRN

## 2023-10-02 NOTE — Progress Notes (Signed)
 NEW OB HISTORY AND PHYSICAL  SUBJECTIVE:       Tamara [name] is a 28 y.o. G74P1001 female, Patient's last menstrual period was 07/03/2023 (exact date)., Estimated Date of Delivery: 04/08/24, [redacted]w[redacted]d, presents today for establishment of Prenatal Care. She reports no complaints. Feeling well this pregnancy, planned & desired, her partner's first & her first to parent. Her first child was adopted at 52m old.   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 Living situation: Merchant navy officer & his mother Work: not currently employed Exercise: walking 4 dogs Substance use: cigarrettes-~6/day, down from 10/d; denies E/D  Indications for ASA therapy (per uptodate) One of the following: Previous pregnancy with preeclampsia, especially early onset and with an adverse outcome No Multifetal gestation No Chronic hypertension No Type 1 or 2 diabetes mellitus No Chronic kidney disease No Autoimmune disease (antiphospholipid syndrome, systemic lupus erythematosus) No  Two or more of the following: Nulliparity No Obesity (body mass index >30 kg/m2) No Family history of preeclampsia in mother or sister No Age >=35 years No Sociodemographic characteristics (African American race, low socioeconomic level) No Personal risk factors (eg, previous pregnancy with low birth weight or small for gestational age infant, previous adverse pregnancy outcome [eg, stillbirth], interval >10 years between pregnancies) No   Gynecologic History Patient's last menstrual period was 07/03/2023 (exact date). Normal Contraception: none Last Pap: unsure, may have had one after prior birth.  Obstetric History OB History  Gravida Para Term Preterm AB Living  2 1 1   0 1  SAB IAB Ectopic Multiple Live Births  0    1    # Outcome Date GA Lbr Len/2nd Weight Sex Type Anes PTL Lv  2 Current           1 Term 07/02/17    M CS-Unspec   LIV    Obstetric Comments  Pt states gave child up for adoption    Past Medical History:  Diagnosis  Date   Anemia    Constipation during pregnancy    Late prenatal care    at 24 wks   Marijuana use    2 wks ago from today 07/01/17   Medical history non-contributory    No prenatal care in current pregnancy in third trimester 06/28/2017   Noncompliance    Substance abuse (HCC)    cocaine, 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   UTI (urinary tract infection) during pregnancy     Past Surgical History:  Procedure Laterality Date   CESAREAN SECTION N/A 07/02/2017   Procedure: CESAREAN SECTION;  Surgeon: Schermerhorn, Ihor Austin, MD;  Location: [facility] ORS;  Service: Obstetrics;  Laterality: N/A;   NO PAST SURGERIES      Current Outpatient Medications on File Prior to Visit  Medication Sig Dispense Refill   Prenatal Vit-Fe Fumarate-FA (PRENATAL VITAMINS) 28-0.8 MG TABS Take 28 mg by mouth daily.     Prenatal Vit-Fe Fumarate-FA (PRENATAL MULTIVITAMIN) TABS tablet Take 1 tablet by mouth daily at 12 noon.     No current facility-administered medications on file prior to visit.    No Known Allergies  Social History   Socioeconomic History   Marital status: Significant Other    Spouse name: Not on file   Number of children: 1   Years of education: 64   Highest education level: Not on file  Occupational History   Not on file  Tobacco Use   Smoking status: Every Day    Average packs/day: 0.7 packs/day for 14.0 years (9.5 ttl pk-yrs)    Types: Cigarettes  Start date: 2016   Smokeless tobacco: Never  Vaping Use   Vaping status: Never Used  Substance and Sexual Activity   Alcohol use: Not Currently    Comment: socially; last ETOH Nov 2024   Drug use: Not Currently    Types: Marijuana, Cocaine    Comment: last MJ use mid Feb 2025; states hx cocaine 2019 pregnancy and denies since then   Sexual activity: Yes    Birth control/protection: None  Other Topics Concern   Not on file  Social History Narrative   Not on file   Social Drivers of Health   Financial Resource Strain: Not on file  Food  Insecurity: No Food Insecurity (09/04/2023)   Hunger Vital Sign    Worried About Running Out of Food in the Last Year: Never true    Ran Out of Food in the Last Year: Never true  Transportation Needs: No Transportation Needs (09/04/2023)   PRAPARE - Administrator, Civil Service (Medical): No    Lack of Transportation (Non-Medical): No  Physical Activity: Not on file  Stress: Not on file  Social Connections: Not on file  Intimate Partner Violence: Not At Risk (08/17/2023)   Humiliation, Afraid, Rape, and Kick questionnaire    Fear of Current or Ex-Partner: No    Emotionally Abused: No    Physically Abused: No    Sexually Abused: No    No family history on file.  The following portions of the patient's history were reviewed and updated as appropriate: allergies, current medications, past OB history, past medical history, past surgical history, past family history, past social history, and problem list.  Constitutional: Denied constitutional symptoms, night sweats, recent illness, fatigue, fever, insomnia and weight loss.  Eyes: Denied eye symptoms, eye pain, photophobia, vision change and visual disturbance.  Ears/Nose/Throat/Neck: Denied ear, nose, throat or neck symptoms, hearing loss, nasal discharge, sinus congestion and sore throat.  Cardiovascular: Denied cardiovascular symptoms, arrhythmia, chest pain/pressure, edema, exercise intolerance, orthopnea and palpitations.  Respiratory: Denied pulmonary symptoms, asthma, pleuritic pain, productive sputum, cough, dyspnea and wheezing.  Gastrointestinal: Denied gastro-esophageal reflux, melena, nausea and vomiting.  Genitourinary: Denied genitourinary symptoms including symptomatic vaginal discharge, pelvic relaxation issues, and urinary complaints.  Musculoskeletal: Denied musculoskeletal symptoms, stiffness, swelling, muscle weakness and myalgia.  Dermatologic: Denied dermatology symptoms, rash and scar.  Neurologic: Denied  neurology symptoms, dizziness, headache, neck pain and syncope.  Psychiatric: Denied psychiatric symptoms, anxiety and depression.  Endocrine: Denied endocrine symptoms including hot flashes and night sweats.     OBJECTIVE: Initial Physical Exam (New OB)  GENERAL APPEARANCE: alert, well appearing, in no apparent distress, oriented to person, place and time HEAD: normocephalic, atraumatic MOUTH: mucous membranes moist, pharynx normal without lesions THYROID: no thyromegaly or masses present BREASTS: no masses noted, no significant tenderness, no palpable axillary nodes, no skin changes LUNGS: clear to auscultation, no wheezes, rales or rhonchi, symmetric air entry HEART: regular rate and rhythm, no murmurs ABDOMEN: soft, nontender, nondistended, no abnormal masses, no epigastric pain SKIN: normal coloration and turgor, no rashes NEUROLOGIC: alert, oriented, normal speech, no focal findings or movement disorder noted  PELVIC EXAM: deferred, plan Pap postpartum  FHR: 150  ASSESSMENT: Normal pregnancy   PLAN: Routine prenatal care. We discussed an overview of prenatal care and when to call. Reviewed diet, exercise, and weight gain recommendations in pregnancy. Discussed benefits of breastfeeding and lactation resources at [facility]. I reviewed labs and answered all questions.  -Plans to bottle feed, had poor experience  emotionally & physically with first -Has cut down on cigarettes, interested in assistance & prefers gum over patch. -Reviewed delivery at [facility]-partner interested in delivery at [facility], reviewed that if this is desired for site of delivery transfer of prenatal care needed as we cannot schedule rLTCS at [facility]. Will discuss with partner.  1. Encounter for supervision of low-risk pregnancy in second trimester (Primary) - NOB Panel; Future - Culture, OB Urine - Monitor Drug Profile 14(MW) - Nicotine screen, urine - Urinalysis, Routine w reflex microscopic - Hgb Fractionation  Cascade - Toxoplasma antibodies- IgG and  IgM - MaterniT 21 plus Core, Blood - Cervicovaginal ancillary only - NOB Panel  2. [redacted] weeks gestation of pregnancy - NOB Panel; Future - Culture, OB Urine - Monitor Drug Profile 14(MW) - Nicotine screen, urine - Urinalysis, Routine w reflex microscopic - Hgb Fractionation Cascade - Toxoplasma antibodies- IgG and  IgM - MaterniT 21 plus Core, Blood - Cervicovaginal ancillary only - NOB Panel  3. Antenatal screening encounter - NOB Panel; Future - Culture, OB Urine - Monitor Drug Profile 14(MW) - Nicotine screen, urine - Urinalysis, Routine w reflex microscopic - Hgb Fractionation Cascade - Toxoplasma antibodies- IgG and  IgM - MaterniT 21 plus Core, Blood - Cervicovaginal ancillary only - NOB Panel  4. Screening for human immunodeficiency virus - NOB Panel; Future - NOB Panel  5. Screen for sexually transmitted diseases - NOB Panel; Future - NOB Panel  6. Genetic screening - MaterniT 21 plus Core, Blood  7. Pregnancy with history of cesarean section, antepartum  8. Tobacco use affecting pregnancy in second trimester, antepartum - nicotine polacrilex (NICORETTE) 2 MG gum; Take 1 each (2 mg total) by mouth as needed for smoking cessation.  Dispense: 100 tablet; Refill: 1   Forestine Igo, CNM

## 2023-10-02 NOTE — Telephone Encounter (Signed)
 Reached out to pt to schedule 4 week f/u ROB appt (around 10/30/2023).  Pt had NOB appt on 10/02/2023 with Binnie Buffalo.  Could not leave a message bc mailbox was full.

## 2023-10-02 NOTE — Assessment & Plan Note (Signed)
 Red flag symptoms, how to contact provider & seek urgent care reviewed.

## 2023-10-02 NOTE — Patient Instructions (Signed)

## 2023-10-03 LAB — CERVICOVAGINAL ANCILLARY ONLY
Chlamydia: NEGATIVE
Comment: NEGATIVE
Comment: NORMAL
Neisseria Gonorrhea: NEGATIVE

## 2023-10-03 LAB — URINALYSIS, ROUTINE W REFLEX MICROSCOPIC
Bilirubin, UA: NEGATIVE
Glucose, UA: NEGATIVE
Ketones, UA: NEGATIVE
Leukocytes,UA: NEGATIVE
Nitrite, UA: NEGATIVE
Protein,UA: NEGATIVE
RBC, UA: NEGATIVE
Specific Gravity, UA: 1.015 (ref 1.005–1.030)
Urobilinogen, Ur: 1 mg/dL (ref 0.2–1.0)
pH, UA: 7.5 (ref 5.0–7.5)

## 2023-10-03 NOTE — Telephone Encounter (Signed)
 Pt is scheduled on 10/30/2023 at 10:15 with JEG for ROB appt.

## 2023-10-04 LAB — MONITOR DRUG PROFILE 14(MW)
Amphetamine Scrn, Ur: NEGATIVE ng/mL
BARBITURATE SCREEN URINE: NEGATIVE ng/mL
BENZODIAZEPINE SCREEN, URINE: NEGATIVE ng/mL
Buprenorphine, Urine: NEGATIVE ng/mL
CANNABINOIDS UR QL SCN: NEGATIVE ng/mL
Cocaine (Metab) Scrn, Ur: NEGATIVE ng/mL
Creatinine(Crt), U: 49.4 mg/dL (ref 20.0–300.0)
Fentanyl, Urine: NEGATIVE pg/mL
Meperidine Screen, Urine: NEGATIVE ng/mL
Methadone Screen, Urine: NEGATIVE ng/mL
OXYCODONE+OXYMORPHONE UR QL SCN: NEGATIVE ng/mL
Opiate Scrn, Ur: NEGATIVE ng/mL
Ph of Urine: 7.9 (ref 4.5–8.9)
Phencyclidine Qn, Ur: NEGATIVE ng/mL
Propoxyphene Scrn, Ur: NEGATIVE ng/mL
SPECIFIC GRAVITY: 1.014
Tramadol Screen, Urine: NEGATIVE ng/mL

## 2023-10-04 LAB — CULTURE, OB URINE

## 2023-10-04 LAB — NICOTINE SCREEN, URINE: Cotinine Ql Scrn, Ur: POSITIVE ng/mL — AB

## 2023-10-04 LAB — URINE CULTURE, OB REFLEX

## 2023-10-05 ENCOUNTER — Encounter: Payer: Self-pay | Admitting: Certified Nurse Midwife

## 2023-10-05 LAB — CBC/D/PLT+RPR+RH+ABO+RUBIGG...
Antibody Screen: NEGATIVE
Basophils Absolute: 0 10*3/uL (ref 0.0–0.2)
Basos: 0 %
EOS (ABSOLUTE): 0.2 10*3/uL (ref 0.0–0.4)
Eos: 2 %
HCV Ab: NONREACTIVE
HIV Screen 4th Generation wRfx: NONREACTIVE
Hematocrit: 36.2 % (ref 34.0–46.6)
Hemoglobin: 12.4 g/dL (ref 11.1–15.9)
Hepatitis B Surface Ag: NEGATIVE
Immature Grans (Abs): 0 10*3/uL (ref 0.0–0.1)
Immature Granulocytes: 0 %
Lymphocytes Absolute: 2.1 10*3/uL (ref 0.7–3.1)
Lymphs: 22 %
MCH: 30.1 pg (ref 26.6–33.0)
MCHC: 34.3 g/dL (ref 31.5–35.7)
MCV: 88 fL (ref 79–97)
Monocytes Absolute: 0.5 10*3/uL (ref 0.1–0.9)
Monocytes: 5 %
Neutrophils Absolute: 6.8 10*3/uL (ref 1.4–7.0)
Neutrophils: 71 %
Platelets: 307 10*3/uL (ref 150–450)
RBC: 4.12 x10E6/uL (ref 3.77–5.28)
RDW: 12.8 % (ref 11.7–15.4)
RPR Ser Ql: NONREACTIVE
Rh Factor: NEGATIVE
Rubella Antibodies, IGG: 2.54 {index} (ref >0.99)
Varicella zoster IgG: REACTIVE
WBC: 9.7 10*3/uL (ref 3.4–10.8)

## 2023-10-05 LAB — TOXOPLASMA ANTIBODIES- IGG AND  IGM
Toxoplasma Antibody- IgM: <3 [AU]/ml (ref 0.0–7.9)
Toxoplasma IgG Ratio: <3 [IU]/mL (ref 0.0–7.1)

## 2023-10-05 LAB — HGB FRACTIONATION CASCADE
Hgb A2: 2.8 % (ref 1.8–3.2)
Hgb A: 97.2 % (ref 96.4–98.8)
Hgb F: 0 % (ref 0.0–2.0)
Hgb S: 0 %

## 2023-10-05 LAB — HCV INTERPRETATION

## 2023-10-06 LAB — MATERNIT 21 PLUS CORE, BLOOD
Fetal Fraction: 17
Result (T21): NEGATIVE
Trisomy 13 (Patau syndrome): NEGATIVE
Trisomy 18 (Edwards syndrome): NEGATIVE
Trisomy 21 (Down syndrome): NEGATIVE

## 2023-10-30 ENCOUNTER — Encounter: Payer: PRIVATE HEALTH INSURANCE | Admitting: Advanced Practice Midwife

## 2023-10-30 ENCOUNTER — Ambulatory Visit (INDEPENDENT_AMBULATORY_CARE_PROVIDER_SITE_OTHER): Admitting: Advanced Practice Midwife

## 2023-10-30 VITALS — BP 119/53 | HR 88 | Wt 134.5 lb

## 2023-10-30 DIAGNOSIS — Z3492 Encounter for supervision of normal pregnancy, unspecified, second trimester: Secondary | ICD-10-CM | POA: Diagnosis not present

## 2023-10-30 DIAGNOSIS — Z3689 Encounter for other specified antenatal screening: Secondary | ICD-10-CM

## 2023-10-30 DIAGNOSIS — Z3A17 17 weeks gestation of pregnancy: Secondary | ICD-10-CM

## 2023-10-30 NOTE — Progress Notes (Signed)
 Routine Prenatal Care Visit  Subjective  Tamara [name] is a 28 y.o. G2P1001 at [redacted]w[redacted]d being seen today for ongoing prenatal care.  She is currently monitored for the following issues for this low-risk pregnancy and has Personal history of previous postdates pregnancy; Supervision of low-risk pregnancy; and Pregnancy with history of cesarean section, antepartum on their problem list.  ----------------------------------------------------------------------------------- Patient reports she is doing well. Feeling some movement.   Contractions: Not present. Vag. Bleeding: None.  Movement: Present. Leaking Fluid denies.  ----------------------------------------------------------------------------------- The following portions of the patient's history were reviewed and updated as appropriate: allergies, current medications, past family history, past medical history, past social history, past surgical history and problem list. Problem list updated.  Objective  Blood pressure (!) 119/53, pulse 88, weight 134 lb 8 oz (61 kg), last menstrual period 07/03/2023 Pregravid weight 120 lb (54.4 kg) Total Weight Gain 14 lb 8 oz (6.577 kg) Urinalysis: Urine Protein    Urine Glucose    Fetal Status: Fetal Heart Rate (bpm): 143   Movement: Present     General:  Alert, oriented and cooperative. Patient is in no acute distress.  Skin: Skin is warm and dry. No rash noted.   Cardiovascular: Normal heart rate noted  Respiratory: Normal respiratory effort, no problems with respiration noted  Abdomen: Soft, gravid, appropriate for gestational age. Pain/Pressure: Absent     Pelvic:  Cervical exam deferred        Extremities: Normal range of motion.  Edema: None  Mental Status: Normal mood and affect. Normal behavior. Normal judgment and thought content.   Assessment   28 y.o. G2P1001 at [redacted]w[redacted]d by  04/08/2024, by Last Menstrual Period presenting for routine prenatal visit  Plan   G2 Problems (from 09/04/23 to  present)     Problem Noted Diagnosed Resolved   Pregnancy with history of cesarean section, antepartum 10/02/2023 by Forestine Igo, CNM  No   Overview Signed 10/02/2023 12:26 PM by Forestine Igo, CNM  Desires repeat.      Supervision of low-risk pregnancy 09/04/2023 by Vale Garrison, CMA  No   Clinical Staff Provider  Office Location  [facility] Dating  04/08/2024, by Last Menstrual Period  Language  English Anatomy US     Flu Vaccine  no Genetic Screen  NIPS: negative, female  RSV Vaccine      Covid Vaccine  no    TDaP vaccine    Hgb A1C or  GTT Early : Third trimester :   Covid    LAB RESULTS   Rhogam  A/Negative/-- (04/14 1101)  Blood Type A/Negative/-- (04/14 1101)   RSV  Antibody Negative (04/14 1101)  Feeding Plan bottle Rubella 2.54 (04/14 1101)  Contraception unsure RPR Non Reactive (04/14 1101)   Circumcision yes HBsAg Negative (04/14 1101)   Pediatrician  Ivette Marks HIV Non Reactive (04/14 1101)  Support Person Arlyss Berkeley Varicella Reactive (04/14 1101)  Prenatal Classes maybe GBS  (For PCN allergy, check sensitivities)     Hep C Non Reactive (04/14 1101)   BTL Consent  Pap No results found for: "DIAGPAP"  VBAC Consent  Hgb Electro      CF      SMA            Preterm labor symptoms and general obstetric precautions including but not limited to vaginal bleeding, contractions, leaking of fluid and fetal movement were reviewed in detail with the patient. Please refer to After Visit Summary for other counseling recommendations.  Return in about 3 weeks (around 11/20/2023) for anatomy scan and rob after.  Angelita Kendall, CNM 10/30/2023 4:02 PM

## 2023-11-15 ENCOUNTER — Ambulatory Visit
Admission: RE | Admit: 2023-11-15 | Discharge: 2023-11-15 | Disposition: A | Discharge status: Home / Self Care | Source: Ambulatory Visit | Attending: Advanced Practice Midwife | Admitting: Advanced Practice Midwife

## 2023-11-15 DIAGNOSIS — Z3492 Encounter for supervision of normal pregnancy, unspecified, second trimester: Secondary | ICD-10-CM | POA: Diagnosis present

## 2023-11-15 DIAGNOSIS — Z3689 Encounter for other specified antenatal screening: Secondary | ICD-10-CM | POA: Insufficient documentation

## 2023-11-15 DIAGNOSIS — Z3A19 19 weeks gestation of pregnancy: Secondary | ICD-10-CM | POA: Diagnosis not present

## 2023-11-15 DIAGNOSIS — Z363 Encounter for antenatal screening for malformations: Secondary | ICD-10-CM | POA: Insufficient documentation

## 2023-11-20 ENCOUNTER — Encounter: Admitting: Licensed Practical Nurse

## 2023-11-20 ENCOUNTER — Telehealth: Payer: Self-pay | Admitting: Licensed Practical Nurse

## 2023-11-20 NOTE — Telephone Encounter (Signed)
 Reached out to pt to reschedule ROB appt that was scheduled on 11/20/2023 at 3:35 with LMD.  Left message for pt to call back to reschedule.

## 2023-11-21 ENCOUNTER — Encounter: Payer: Self-pay | Admitting: Licensed Practical Nurse

## 2023-11-21 NOTE — Telephone Encounter (Signed)
 Reached out to pt (2x) to reschedule ROB appt that was scheduled on 11/20/2023 at 3:35 with LMD.  Left message for pt to call back to reschedule.  Will send a MyChart letter to pt.

## 2023-11-29 ENCOUNTER — Ambulatory Visit: Admitting: Licensed Practical Nurse

## 2023-11-29 VITALS — BP 103/65 | HR 80 | Wt 139.4 lb

## 2023-11-29 DIAGNOSIS — Z3A21 21 weeks gestation of pregnancy: Secondary | ICD-10-CM | POA: Diagnosis not present

## 2023-11-29 DIAGNOSIS — O34219 Maternal care for unspecified type scar from previous cesarean delivery: Secondary | ICD-10-CM

## 2023-11-29 DIAGNOSIS — Z3402 Encounter for supervision of normal first pregnancy, second trimester: Secondary | ICD-10-CM

## 2023-11-29 NOTE — Progress Notes (Signed)
    Return Prenatal Note   Subjective   28 y.o. G2P1001 at [redacted]w[redacted]d presents for this follow-up prenatal visit.  Patient Doing well, mood has been good.  Patient reports: Movement: Present Contractions: Not present  Objective   Flow sheet Vitals: Pulse Rate: 80 BP: 103/65 Fundal Height: 22 cm Fetal Heart Rate (bpm): 145 Total weight gain: 19 lb 6.4 oz (8.8 kg)  General Appearance  No acute distress, well appearing, and well nourished Pulmonary   Normal work of breathing Neurologic   Alert and oriented to person, place, and time Psychiatric   Mood and affect within normal limits  Assessment/Plan   Plan  28 y.o. G2P1001 at [redacted]w[redacted]d presents for follow-up OB visit. Reviewed prenatal record including previous visit note.  Pregnancy with history of cesarean section, antepartum -desires repeat, will see MD at 32 weeks  Supervision of low-risk pregnancy -Normal anatomy US  -TWG 19lbs which is fine -Is unsure about breastfeeding, has been looking into it but plan to formula feed -[place], [place] given  -warning sings reviewed      No orders of the defined types were placed in this encounter.  No follow-ups on file.   Future Appointments  Date Time Provider Department Center  12/27/2023  3:35 PM Sofia Dunn, MD [facility] None    For next visit:  continue with routine prenatal care     Tanaia Hawkey [facility], CNM  06/11/254:50 PM

## 2023-11-29 NOTE — Assessment & Plan Note (Signed)
-  desires repeat, will see MD at 32 weeks

## 2023-11-29 NOTE — Assessment & Plan Note (Signed)
-  Normal anatomy US  -TWG 19lbs which is fine -Is unsure about breastfeeding, has been looking into it but plan to formula feed -plans [place] the beach, precautions given  -warning sings reviewed

## 2023-12-27 ENCOUNTER — Ambulatory Visit: Admitting: Obstetrics

## 2023-12-27 VITALS — BP 117/55 | HR 105 | Wt 146.0 lb

## 2023-12-27 DIAGNOSIS — Z3492 Encounter for supervision of normal pregnancy, unspecified, second trimester: Secondary | ICD-10-CM

## 2023-12-27 DIAGNOSIS — Z131 Encounter for screening for diabetes mellitus: Secondary | ICD-10-CM

## 2023-12-27 DIAGNOSIS — O360120 Maternal care for anti-D [Rh] antibodies, second trimester, not applicable or unspecified: Secondary | ICD-10-CM | POA: Diagnosis not present

## 2023-12-27 DIAGNOSIS — Z3A25 25 weeks gestation of pregnancy: Secondary | ICD-10-CM | POA: Diagnosis not present

## 2023-12-27 DIAGNOSIS — O34219 Maternal care for unspecified type scar from previous cesarean delivery: Secondary | ICD-10-CM | POA: Diagnosis not present

## 2023-12-27 DIAGNOSIS — Z113 Encounter for screening for infections with a predominantly sexual mode of transmission: Secondary | ICD-10-CM

## 2023-12-27 DIAGNOSIS — Z6791 Unspecified blood type, Rh negative: Secondary | ICD-10-CM

## 2023-12-27 DIAGNOSIS — Z130 Encounter for screening for diseases of the blood and blood-forming organs and certain disorders involving the immune mechanism: Secondary | ICD-10-CM

## 2023-12-27 NOTE — Progress Notes (Signed)
    Return Prenatal Note   Subjective  28 y.o. G2P1001 at [redacted]w[redacted]d presents for this follow-up prenatal visit.   Patient wanting to schedule rCD today, greatly desires [birth year]/01/24 as son's birthday; her birthday is [birth year] and FOB's birthday is [birth year].   Patient reports: Movement: Present Contractions: Not present Denies vaginal bleeding or leaking fluid. Objective  Flow sheet Vitals: Pulse Rate: (!) 105 BP: (!) 117/55 Fundal Height: 25 cm Fetal Heart Rate (bpm): 140 Total weight gain: 26 lb (11.8 kg)  General Appearance  No acute distress, well appearing, and well nourished Pulmonary   Normal work of breathing Neurologic   Alert and oriented to person, place, and time Psychiatric   Mood and affect within normal limits   Assessment/Plan   Plan  28 y.o. G2P1001 at [redacted]w[redacted]d by LMP=11wk US  presents for follow-up OB visit. Reviewed prenatal record including previous visit note.  1. Encounter for supervision of low-risk pregnancy in second trimester (Primary) 2. Pregnancy with history of cesarean section, antepartum 3. Rh negative state in antepartum period 4. [redacted] weeks gestation of pregnancy -Repeat cesarean scheduled for 04/01/24 w/L&D ([redacted]w[redacted]d), will discuss preferences and pre-op instructions further in 3rd trimester -28wk labs, 1hGTT instructions, Rhogam and Tdap discussed for next visit   Orders Placed This Encounter  Procedures   28 Weeks RH-Panel    Standing Status:   Future    Expected Date:   01/17/2024    Expiration Date:   12/26/2024   Return in about 3 weeks (around 01/17/2024) for rob and gtt.   Future Appointments  Date Time Provider Department Center  01/17/2024  9:20 AM [facility] None  01/17/2024  9:35 AM Jayne Harlene CROME, CNM [facility] None   For next visit:  continue with routine prenatal care   Estil Mangle, DO [facility]

## 2023-12-27 NOTE — Patient Instructions (Signed)
 Glucose Tolerance Test Why am I having this test? The glucose tolerance test (GTT) is done to check how your body processes sugar (glucose). This is one of several tests used to diagnose diabetes (diabetes mellitus). Your health care provider may recommend this test if you: Have a family history of diabetes. Are obese. Have infections that keep coming back. Have had a lot of wounds that did not heal quickly, especially on your legs and feet. Are a woman and have a history of giving birth to very large babies or a history of repeated fetal loss (stillbirth). Have had high glucose levels in your urine or blood: During a past pregnancy. After a heart attack, surgery, or prolonged periods of high stress. What is being tested? This test measures the amount of glucose in your blood at different times during a period of 2 hours. This indicates how well your body is able to process glucose. What kind of sample is taken?  Blood samples are required for this test. They are usually collected by inserting a needle into a blood vessel. How do I prepare for this test? For 3 days before your test, eat normally. Make sure to eat enough carbohydrates (at least 150 grams per day). Follow instructions from your health care provider about: Eating or drinking restrictions on the day of the test. You may be asked to not eat or drink anything other than water (fast) starting 8-12 hours before the test. Changing or stopping your regular medicines. Some medicines may interfere with this test. Tell a health care provider about: All medicines you are taking, including vitamins, herbs, eye drops, creams, and over-the-counter medicines. Any blood disorders you have. Any surgeries you have had. Any medical conditions you have. Whether you are pregnant or may be pregnant. What happens during the test? First, your blood glucose will be measured. This is your fasting blood glucose since you fasted before the test. Then,  you will drink a glucose solution containing a specific amount of glucose. Your blood glucose will be measured again 1 and 2 hours after drinking the solution. This test takes 2 hours to complete. You will need to stay at the testing location during this time. During the testing period: Do not eat or drink anything other than the glucose solution. You will be allowed to drink water. Do not exercise. Do not use any products that contain nicotine or tobacco. These products include cigarettes, chewing tobacco, and vaping devices, such as e-cigarettes. If you need help quitting, ask your health care provider. The testing procedure may vary among health care providers and hospitals. How are the results reported? Your test results will be reported as values. These will be given as milligrams of glucose per deciliter of blood (mg/dL) or millimoles per liter (mmol/L). Your health care provider will compare your results to normal ranges that were established after testing a large group of people (reference ranges). Reference ranges may vary among labs and hospitals. For this test, common reference ranges are: Fasting: less than 110 mg/dL (6.1 mmol/L). 1 hour after drinking glucose: less than 180 mg/dL (16.1 mmol/L). 2 hours after drinking glucose: less than 140 mg/dL (7.8 mmol/L). What do the results mean? Results within the reference ranges are considered normal, meaning your glucose levels are normal. Results higher than the reference ranges may indicate you have diabetes. Talk with your health care provider about what your results mean. Questions to ask your health care provider Ask your health care provider, or the department that  is doing the test: When will my results be ready? How will I get my results? What are my treatment options? What other tests do I need? What are my next steps? Summary The GTT is done to check how your body processes sugar (glucose). This is one of several tests used to  diagnose diabetes. This test measures the amount of glucose in your blood at different times during a period of 2 hours. This indicates how well your body is able to process glucose. Talk with your health care provider about what your results mean. This information is not intended to replace advice given to you by your health care provider. Make sure you discuss any questions you have with your health care provider. Document Revised: 01/11/2022 Document Reviewed: 07/21/2021 Elsevier Patient Education  2024 [employer].

## 2024-01-17 ENCOUNTER — Encounter: Payer: Self-pay | Admitting: Certified Nurse Midwife

## 2024-01-17 ENCOUNTER — Other Ambulatory Visit

## 2024-01-17 ENCOUNTER — Ambulatory Visit: Admitting: Certified Nurse Midwife

## 2024-01-17 VITALS — BP 121/62 | HR 94 | Wt 151.0 lb

## 2024-01-17 DIAGNOSIS — O360130 Maternal care for anti-D [Rh] antibodies, third trimester, not applicable or unspecified: Secondary | ICD-10-CM

## 2024-01-17 DIAGNOSIS — O34219 Maternal care for unspecified type scar from previous cesarean delivery: Secondary | ICD-10-CM

## 2024-01-17 DIAGNOSIS — O26893 Other specified pregnancy related conditions, third trimester: Secondary | ICD-10-CM | POA: Diagnosis not present

## 2024-01-17 DIAGNOSIS — Z3492 Encounter for supervision of normal pregnancy, unspecified, second trimester: Secondary | ICD-10-CM

## 2024-01-17 DIAGNOSIS — Z131 Encounter for screening for diabetes mellitus: Secondary | ICD-10-CM

## 2024-01-17 DIAGNOSIS — O26899 Other specified pregnancy related conditions, unspecified trimester: Secondary | ICD-10-CM

## 2024-01-17 DIAGNOSIS — Z23 Encounter for immunization: Secondary | ICD-10-CM | POA: Diagnosis not present

## 2024-01-17 DIAGNOSIS — Z130 Encounter for screening for diseases of the blood and blood-forming organs and certain disorders involving the immune mechanism: Secondary | ICD-10-CM

## 2024-01-17 DIAGNOSIS — Z3490 Encounter for supervision of normal pregnancy, unspecified, unspecified trimester: Secondary | ICD-10-CM

## 2024-01-17 DIAGNOSIS — Z113 Encounter for screening for infections with a predominantly sexual mode of transmission: Secondary | ICD-10-CM

## 2024-01-17 DIAGNOSIS — Z3A28 28 weeks gestation of pregnancy: Secondary | ICD-10-CM | POA: Diagnosis not present

## 2024-01-17 DIAGNOSIS — Z3A25 25 weeks gestation of pregnancy: Secondary | ICD-10-CM

## 2024-01-17 DIAGNOSIS — Z6791 Unspecified blood type, Rh negative: Secondary | ICD-10-CM

## 2024-01-17 MED ORDER — RHO D IMMUNE GLOBULIN 1500 UNIT/2ML IJ SOSY
300.0000 ug | PREFILLED_SYRINGE | Freq: Once | INTRAMUSCULAR | Status: AC
  Administered 2024-01-17: 300 ug via INTRAMUSCULAR

## 2024-01-17 NOTE — Assessment & Plan Note (Signed)
 Reviewed kick counts and preterm labor warning signs. Instructed to call office or come to hospital with persistent headache, vision changes, regular contractions, leaking of fluid, decreased fetal movement or vaginal bleeding.

## 2024-01-17 NOTE — Progress Notes (Signed)
    Return Prenatal Note   Subjective   28 y.o. G2P1001 at [redacted]w[redacted]d presents for this follow-up prenatal visit.  Patient feeling well, active baby, no concerns voiced. GDM screening, tdap & rhogam today.  Patient reports: Movement: Present Contractions: Not present  Objective   Flow sheet Vitals: Pulse Rate: 94 BP: 121/62 Fundal Height: 28 cm Fetal Heart Rate (bpm): 130 Total weight gain: 31 lb (14.1 kg)  General Appearance  No acute distress, well appearing, and well nourished Pulmonary   Normal work of breathing Neurologic   Alert and oriented to person, place, and time Psychiatric   Mood and affect within normal limits  Assessment/Plan   Plan  28 y.o. G2P1001 at [redacted]w[redacted]d presents for follow-up OB visit. Reviewed prenatal record including previous visit note.  Supervision of low-risk pregnancy Reviewed kick counts and preterm labor warning signs. Instructed to call office or come to hospital with persistent headache, vision changes, regular contractions, leaking of fluid, decreased fetal movement or vaginal bleeding.      Orders Placed This Encounter  Procedures   Tdap vaccine greater than or equal to 7yo IM   Return in 2 weeks (on 01/31/2024) for ROB.   No future appointments.  For next visit:  continue with routine prenatal care     Harlene LITTIE Cisco, CNM  07/30/259:59 AM

## 2024-01-17 NOTE — Patient Instructions (Signed)
 Third Trimester of Pregnancy  The third trimester of pregnancy is from week 28 through week 40. This is months 7 through 9. The third trimester is a time when your baby is growing fast. Body changes during your third trimester Your body continues to change during this time. The changes usually go away after your baby is born. Physical changes You will continue to gain weight. You may get stretch marks on your hips, belly, and breasts. Your breasts will keep growing and may hurt. A yellow fluid (colostrum) may leak from your breasts. This is the first milk you're making for your baby. Your hair may grow faster and get thicker. In some cases, you may get hair loss. Your belly button may stick out. You may have more swelling in your hands, face, or ankles. Health changes You may have heartburn. You may feel short of breath. This is caused by the uterus that is now bigger. You may have more aches in the pelvis, back, or thighs. You may have more tingling or numbness in your hands, arms, and legs. You may pee more often. You may have trouble pooping (constipation) or swollen veins in the butt that can itch or get painful (hemorrhoids). Other changes You may have more problems sleeping. You may notice the baby moving lower in your belly (dropping). You may have more fluid coming from your vagina. Your joints may feel loose, and you may have pain around your pelvic bone. Follow these instructions at home: Medicines Take medicines only as told by your health care provider. Some medicines are not safe during pregnancy. Your provider may change the medicines that you take. Do not take any medicines unless told to by your provider. Take a prenatal vitamin that has at least 600 micrograms (mcg) of folic acid. Do not use herbal medicines, illegal drugs, or medicines that are not approved by your provider. Eating and drinking While you're pregnant your body needs additional nutrition to help  support your growing baby. Talk with your provider about your nutritional needs. Activity Most women are able to exercise regularly during pregnancy. Exercise routines may need to change at the end of your pregnancy. Talk to your provider about your activities and exercise routine. Relieving pain and discomfort Rest often with your legs raised if you have leg cramps or low back pain. Take warm sitz baths to soothe pain from hemorrhoids. Use hemorrhoid cream if your provider says it's okay. Wear a good, supportive bra if your breasts hurt. Do not use hot tubs, steam rooms, or saunas. Do not douche. Do not use tampons or scented pads. Safety Talk to your provider before traveling far distances. Wear your seatbelt at all times when you're in a car. Talk to your provider if someone hits you, hurts you, or yells at you. Preparing for birth To prepare for your baby: Take childbirth and breastfeeding classes. Visit the hospital and tour the maternity area. Buy a rear-facing car seat. Learn how to install it in your car. General instructions Avoid cat litter boxes and soil used by cats. These things carry germs that can cause harm to your pregnancy and your baby. Do not drink alcohol, smoke, vape, or use products with nicotine or tobacco in them. If you need help quitting, talk with your provider. Keep all follow-up visits for your third trimester. Your provider will do more exams and tests during this trimester. Write down your questions. Take them to your prenatal visits. Your provider also will: Talk with you about  your overall health. Give you advice or refer you to specialists who can help with different needs, including: Mental health and counseling. Foods and healthy eating. Ask for help if you need help with food. Where to find more information American Pregnancy Association: americanpregnancy.org [employer]: acog.org Office on [employer] Health:  [employer] Contact a health care provider if: You have a headache that does not go away when you take medicine. You have any of these problems: You can't eat or drink. You have nausea and vomiting. You have watery poop (diarrhea) for 2 days or more. You have pain when you pee, or your pee smells bad. You have been sick for 2 days or more and aren't getting better. Contact your provider right away if: You have any of these coming from your vagina: Abnormal discharge. Bad-smelling fluid. Bleeding. Your baby is moving less than usual. You have signs of labor: You have any contractions, belly cramping, or have pain in your pelvis or lower back before 37 weeks of pregnancy (preterm labor). You have regular contractions that are less than 5 minutes apart. Your water breaks. You have symptoms of high blood pressure or preeclampsia. These include: A severe, throbbing headache that does not go away. Sudden or extreme swelling of your face, hands, legs, or feet. Vision problems: You see spots. You have blurry vision. Your eyes are sensitive to light. If you can't reach your provider, go to an urgent care or emergency room. Get help right away if: You faint, become confused, or can't think clearly. You have chest pain or trouble breathing. You have any kind of injury, such as from a fall or a car crash. These symptoms may be an emergency. Call 911 right away. Do not wait to see if the symptoms will go away. Do not drive yourself to the hospital. This information is not intended to replace advice given to you by your health care provider. Make sure you discuss any questions you have with your health care provider. Document Revised: 03/09/2023 Document Reviewed: 10/07/2022 Elsevier Patient Education  2024 [employer].

## 2024-01-18 ENCOUNTER — Ambulatory Visit: Payer: Self-pay

## 2024-01-18 ENCOUNTER — Other Ambulatory Visit: Payer: Self-pay

## 2024-01-18 DIAGNOSIS — R7309 Other abnormal glucose: Secondary | ICD-10-CM

## 2024-01-18 LAB — 28 WEEKS RH-PANEL
Antibody Screen: NEGATIVE
Basophils Absolute: 0 x10E3/uL (ref 0.0–0.2)
Basos: 0 %
EOS (ABSOLUTE): 0.2 x10E3/uL (ref 0.0–0.4)
Eos: 2 %
Gestational Diabetes Screen: 143 mg/dL — ABNORMAL HIGH (ref 70–139)
HIV Screen 4th Generation wRfx: NONREACTIVE
Hematocrit: 30.5 % — ABNORMAL LOW (ref 34.0–46.6)
Hemoglobin: 9.9 g/dL — ABNORMAL LOW (ref 11.1–15.9)
Immature Grans (Abs): 0.1 x10E3/uL (ref 0.0–0.1)
Immature Granulocytes: 1 %
Lymphocytes Absolute: 2.1 x10E3/uL (ref 0.7–3.1)
Lymphs: 18 %
MCH: 28.6 pg (ref 26.6–33.0)
MCHC: 32.5 g/dL (ref 31.5–35.7)
MCV: 88 fL (ref 79–97)
Monocytes Absolute: 0.8 x10E3/uL (ref 0.1–0.9)
Monocytes: 7 %
Neutrophils Absolute: 8.3 x10E3/uL — ABNORMAL HIGH (ref 1.4–7.0)
Neutrophils: 72 %
Platelets: 377 x10E3/uL (ref 150–450)
RBC: 3.46 x10E6/uL — ABNORMAL LOW (ref 3.77–5.28)
RDW: 12.7 % (ref 11.7–15.4)
RPR Ser Ql: NONREACTIVE
WBC: 11.4 x10E3/uL — ABNORMAL HIGH (ref 3.4–10.8)

## 2024-01-18 NOTE — Telephone Encounter (Signed)
 Please schedule

## 2024-01-19 IMAGING — CR DG CHEST 2V
2 series · 2 of 2 positions shown · non-contrast
Comparison: None.

CLINICAL DATA: Chest pain.

EXAM:
CHEST - 2 VIEW

[chest pa]
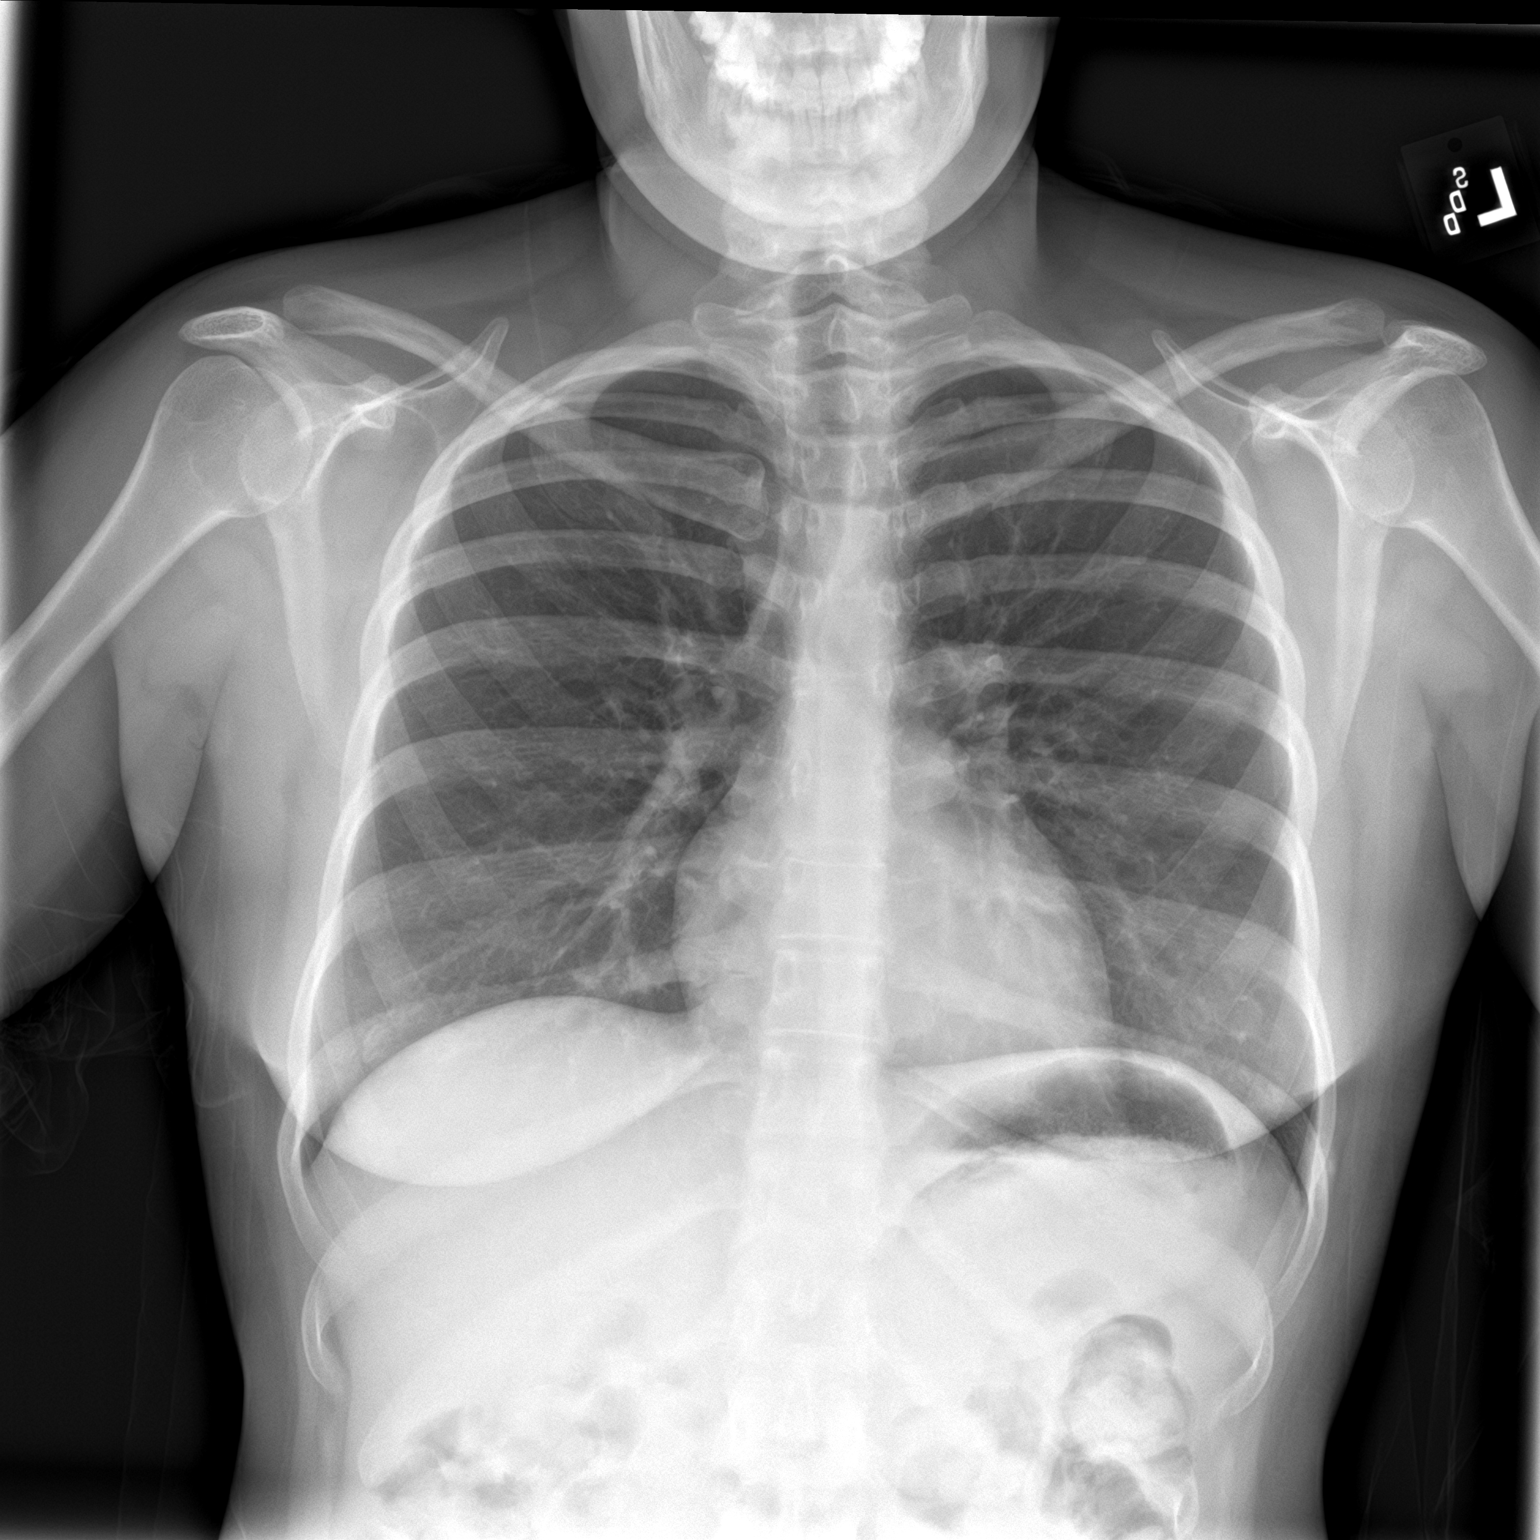

[chest lat]
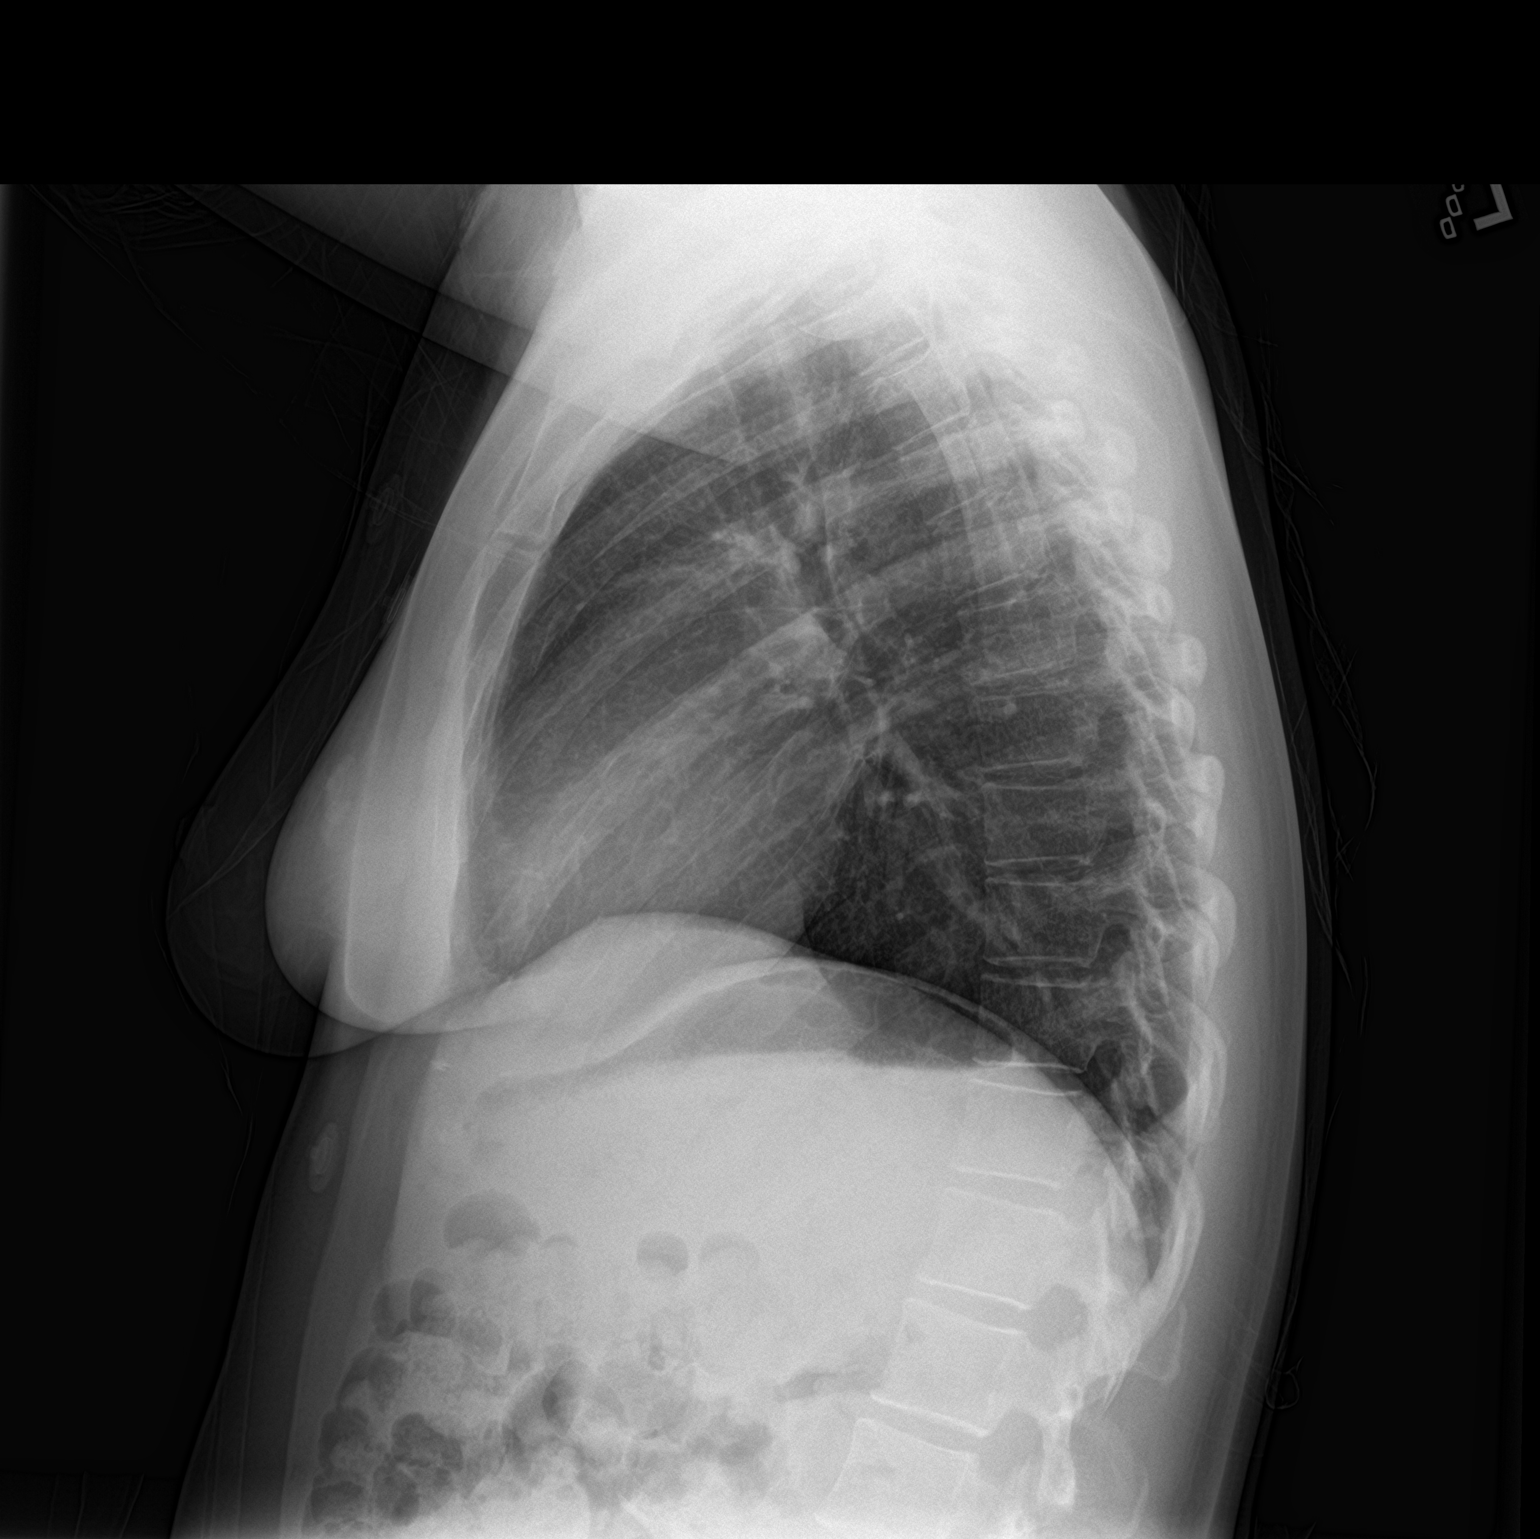

[2 of 2 positions shown; findings below may reference images not displayed]

FINDINGS: The heart size and mediastinal contours are within normal limits.
Both lungs are clear. The visualized skeletal structures are
unremarkable.
IMPRESSION: No active cardiopulmonary disease.

## 2024-01-24 ENCOUNTER — Other Ambulatory Visit

## 2024-01-24 DIAGNOSIS — R7309 Other abnormal glucose: Secondary | ICD-10-CM

## 2024-01-25 LAB — GESTATIONAL GLUCOSE TOLERANCE
Glucose, Fasting: 89 mg/dL (ref 70–94)
Glucose, GTT - 1 Hour: 196 mg/dL — ABNORMAL HIGH (ref 70–179)
Glucose, GTT - 2 Hour: 111 mg/dL (ref 70–154)
Glucose, GTT - 3 Hour: 97 mg/dL (ref 70–139)

## 2024-02-01 ENCOUNTER — Ambulatory Visit (INDEPENDENT_AMBULATORY_CARE_PROVIDER_SITE_OTHER): Admitting: Obstetrics

## 2024-02-01 VITALS — BP 123/71 | HR 92 | Wt 157.4 lb

## 2024-02-01 DIAGNOSIS — Z3A30 30 weeks gestation of pregnancy: Secondary | ICD-10-CM

## 2024-02-01 DIAGNOSIS — O34219 Maternal care for unspecified type scar from previous cesarean delivery: Secondary | ICD-10-CM | POA: Diagnosis not present

## 2024-02-01 DIAGNOSIS — Z3493 Encounter for supervision of normal pregnancy, unspecified, third trimester: Secondary | ICD-10-CM

## 2024-02-01 NOTE — Progress Notes (Signed)
    Return Prenatal Note   Subjective  28 y.o. G2P1001 at [redacted]w[redacted]d presents for this follow-up prenatal visit.   Pt well today, no concerns. Questions re: circumcision, desires to be done in hospital.   Patient reports: Movement: Present Contractions: Not present Denies vaginal bleeding or leaking fluid. Objective  Flow sheet Vitals: Pulse Rate: 92 BP: 123/71 Fundal Height: 30 cm Fetal Heart Rate (bpm): 139 Total weight gain: 37 lb 6.4 oz (17 kg)  General Appearance  No acute distress, well appearing, and well nourished Pulmonary   Normal work of breathing Neurologic   Alert and oriented to person, place, and time Psychiatric   Mood and affect within normal limits   Assessment/Plan   Plan  28 y.o. G2P1001 at [redacted]w[redacted]d by LMP=11wk US  presents for follow-up OB visit. Reviewed prenatal record including previous visit note.  1. Encounter for supervision of low-risk pregnancy in second trimester (Primary) 2. Pregnancy with history of cesarean section, antepartum -Repeat cesarean scheduled for 04/01/24 w/L&D ([redacted]w[redacted]d), we discussed preferences and she does not want clear drape and would like to think about ON-Q -Discussed circumcision option for in-hospital prior to DC and should notify care team on admission.   Return in about 2 weeks (around 02/15/2024) for rob.   Future Appointments  Date Time Provider Department Center  02/15/2024  8:15 AM Justino Eleanor HERO, CNM [facility] None  02/29/2024  8:35 AM Slaughterbeck, Damien, CNM [facility] None   For next visit:  continue with routine prenatal care   Estil Mangle, DO [facility]

## 2024-02-15 ENCOUNTER — Encounter: Payer: Self-pay | Admitting: Obstetrics

## 2024-02-15 ENCOUNTER — Ambulatory Visit (INDEPENDENT_AMBULATORY_CARE_PROVIDER_SITE_OTHER): Admitting: Obstetrics

## 2024-02-15 VITALS — BP 123/74 | HR 92 | Wt 159.0 lb

## 2024-02-15 DIAGNOSIS — D649 Anemia, unspecified: Secondary | ICD-10-CM | POA: Diagnosis not present

## 2024-02-15 DIAGNOSIS — O34219 Maternal care for unspecified type scar from previous cesarean delivery: Secondary | ICD-10-CM

## 2024-02-15 DIAGNOSIS — Z3A32 32 weeks gestation of pregnancy: Secondary | ICD-10-CM

## 2024-02-15 DIAGNOSIS — Z3490 Encounter for supervision of normal pregnancy, unspecified, unspecified trimester: Secondary | ICD-10-CM

## 2024-02-15 DIAGNOSIS — O99013 Anemia complicating pregnancy, third trimester: Secondary | ICD-10-CM

## 2024-02-15 DIAGNOSIS — O99019 Anemia complicating pregnancy, unspecified trimester: Secondary | ICD-10-CM | POA: Insufficient documentation

## 2024-02-15 NOTE — Assessment & Plan Note (Addendum)
-  Car seat being delivered today -Prepared for baby at home -Boyfriend will be present at birth -Reviewed kick counts and preterm labor warning signs. Instructed to call office or come to hospital with persistent headache, vision changes, regular contractions, leaking of fluid, decreased fetal movement or vaginal bleeding.

## 2024-02-15 NOTE — Assessment & Plan Note (Signed)
-  Encouraged iron-rich diet, iron supplement QOD

## 2024-02-15 NOTE — Assessment & Plan Note (Signed)
-  CS scheduled for 04/01/2024

## 2024-02-15 NOTE — Progress Notes (Signed)
    Return Prenatal Note   Assessment/Plan   Plan  28 y.o. G2P1001 at [redacted]w[redacted]d presents for follow-up OB visit. Reviewed prenatal record including previous visit note.  Pregnancy with history of cesarean section, antepartum -CS scheduled for 04/01/2024  Supervision of low-risk pregnancy -Car seat being delivered today -Prepared for baby at home -Boyfriend will be present at birth -Reviewed kick counts and preterm labor warning signs. Instructed to call office or come to hospital with persistent headache, vision changes, regular contractions, leaking of fluid, decreased fetal movement or vaginal bleeding.     Anemia affecting pregnancy -Encouraged iron-rich diet, iron supplement QOD    No orders of the defined types were placed in this encounter.  Return in about 2 weeks (around 02/29/2024).   Future Appointments  Date Time Provider Department Center  02/29/2024  8:35 AM Slaughterbeck, Damien, CNM [facility] None    For next visit:  Routine prenatal care    Subjective   Marvalene is feeling well. She received info about adding iron to her diet from [facility]. She is having regular bowel movements. Occasional BH.  Movement: Present Contractions: Irritability  Objective   Flow sheet Vitals: Pulse Rate: 92 BP: 123/74 Fundal Height: 32 cm Fetal Heart Rate (bpm): 144 Total weight gain: 39 lb (17.7 kg)  General Appearance  No acute distress, well appearing, and well nourished Pulmonary   Normal work of breathing Neurologic   Alert and oriented to person, place, and time Psychiatric   Mood and affect within normal limits  Eleanor Canny, CNM 02/15/24 8:44 AM

## 2024-02-27 NOTE — Progress Notes (Deleted)
    Return Prenatal Note   Subjective   28 y.o. G2P1001 at [redacted]w[redacted]d presents for this follow-up prenatal visit.  Patient *** Patient reports:    Objective   Flow sheet Vitals:   Total weight gain: 39 lb (17.7 kg)  General Appearance  No acute distress, well appearing, and well nourished Pulmonary   Normal work of breathing Neurologic   Alert and oriented to person, place, and time Psychiatric   Mood and affect within normal limits   Assessment/Plan   Plan  28 y.o. G2P1001 at [redacted]w[redacted]d presents for follow-up OB visit. Reviewed prenatal record including previous visit note.  No problem-specific Assessment & Plan notes found for this encounter.      No orders of the defined types were placed in this encounter.  No follow-ups on file.   Future Appointments  Date Time Provider Department Center  02/29/2024  8:35 AM Slaughterbeck, Damien, CNM [facility] None  03/25/2024  8:30 AM [facility] None    For next visit:  ROB with GBS screening      Damien Parsley, CNM [facility] 09/09/252:16 PM

## 2024-02-29 ENCOUNTER — Encounter: Admitting: Certified Nurse Midwife

## 2024-03-04 ENCOUNTER — Encounter: Admitting: Certified Nurse Midwife

## 2024-03-04 NOTE — Progress Notes (Deleted)
    Return Prenatal Note   Subjective   28 y.o. G2P1001 at [redacted]w[redacted]d presents for this follow-up prenatal visit.  Patient *** Patient reports:    Objective   Flow sheet Vitals:   Total weight gain: 39 lb (17.7 kg)  General Appearance  No acute distress, well appearing, and well nourished Pulmonary   Normal work of breathing Neurologic   Alert and oriented to person, place, and time Psychiatric   Mood and affect within normal limits   Assessment/Plan   Plan  28 y.o. G2P1001 at [redacted]w[redacted]d presents for follow-up OB visit. Reviewed prenatal record including previous visit note.  No problem-specific Assessment & Plan notes found for this encounter.      No orders of the defined types were placed in this encounter.  No follow-ups on file.   Future Appointments  Date Time Provider Department Center  03/04/2024  3:15 PM Slaughterbeck, Damien, CNM [facility] None  03/25/2024  8:30 AM [facility] None    For next visit:  ROB with GBS screening      Damien Parsley, CNM [facility] 09/15/253:01 PM

## 2024-03-13 NOTE — Progress Notes (Addendum)
    Return Prenatal Note   Subjective   28 y.o. G2P1001 at [redacted]w[redacted]d presents for this follow-up prenatal visit.  Patient Has ERCS scheduled for next month. No c/o. Patient reports: Movement: Present Contractions: Not present  Objective   Flow sheet Vitals: Pulse Rate: (!) 102 BP: 122/76 Fundal Height: 38 cm Fetal Heart Rate (bpm): 140 Presentation: Vertex Total weight gain: 46 lb (20.9 kg)  General Appearance  No acute distress, well appearing, and well nourished Pulmonary   Normal work of breathing Neurologic   Alert and oriented to person, place, and time Psychiatric   Mood and affect within normal limits   Assessment/Plan   Plan  28 y.o. G2P1001 at [redacted]w[redacted]d presents for follow-up OB visit. Reviewed prenatal record including previous visit note.  Pregnancy with history of cesarean section, antepartum Repeat C/S scheduled for 10/13.  Supervision of low-risk pregnancy GBS, GC/CT today. RTC weekly Reviewed labor warning signs and expectations for birth. Instructed to call office or come to hospital with persistent headache, vision changes, regular contractions, leaking of fluid, decreased fetal movement or vaginal bleeding.   Anemia affecting pregnancy Hgb 9.9 on 7/30. She is NOT supplementing with iron. We discussed oral iron supplementation every other day in addition to taking a daily prenatal vitamin with iron. Reviewed importance of taking it consistently.      Orders Placed This Encounter  Procedures   Strep Gp B NAA   Return in 1 week (on 03/21/2024) for Weekly ROB visits.   Future Appointments  Date Time Provider Department Center  03/21/2024 10:15 AM Charma Domino, CNM [facility] None  03/25/2024  8:30 AM [facility] None  03/28/2024  9:35 AM Slaughterbeck, Damien, CNM [facility] None  04/10/2024  1:55 PM Leigh Sober, MD [facility] None    For next visit:  continue with routine prenatal care     Domino Charma, CNM  09/25/254:15 PM

## 2024-03-14 ENCOUNTER — Encounter: Payer: Self-pay | Admitting: Obstetrics

## 2024-03-14 ENCOUNTER — Ambulatory Visit (INDEPENDENT_AMBULATORY_CARE_PROVIDER_SITE_OTHER): Admitting: Obstetrics

## 2024-03-14 ENCOUNTER — Other Ambulatory Visit (HOSPITAL_COMMUNITY)
Admission: RE | Admit: 2024-03-14 | Discharge: 2024-03-14 | Disposition: A | Discharge status: Home / Self Care | Source: Ambulatory Visit | Attending: Obstetrics | Admitting: Obstetrics

## 2024-03-14 VITALS — BP 122/76 | HR 102 | Wt 166.0 lb

## 2024-03-14 DIAGNOSIS — O99013 Anemia complicating pregnancy, third trimester: Secondary | ICD-10-CM

## 2024-03-14 DIAGNOSIS — D649 Anemia, unspecified: Secondary | ICD-10-CM | POA: Diagnosis not present

## 2024-03-14 DIAGNOSIS — Z3685 Encounter for antenatal screening for Streptococcus B: Secondary | ICD-10-CM | POA: Diagnosis present

## 2024-03-14 DIAGNOSIS — Z3A36 36 weeks gestation of pregnancy: Secondary | ICD-10-CM | POA: Diagnosis not present

## 2024-03-14 DIAGNOSIS — O34219 Maternal care for unspecified type scar from previous cesarean delivery: Secondary | ICD-10-CM

## 2024-03-14 DIAGNOSIS — Z113 Encounter for screening for infections with a predominantly sexual mode of transmission: Secondary | ICD-10-CM | POA: Insufficient documentation

## 2024-03-14 DIAGNOSIS — Z3490 Encounter for supervision of normal pregnancy, unspecified, unspecified trimester: Secondary | ICD-10-CM | POA: Diagnosis present

## 2024-03-14 DIAGNOSIS — Z3483 Encounter for supervision of other normal pregnancy, third trimester: Secondary | ICD-10-CM

## 2024-03-14 NOTE — Assessment & Plan Note (Signed)
 Repeat C/S scheduled for 10/13.

## 2024-03-14 NOTE — Assessment & Plan Note (Signed)
 Hgb 9.9 on 7/30. She is NOT supplementing with iron. We discussed oral iron supplementation every other day in addition to taking a daily prenatal vitamin with iron. Reviewed importance of taking it consistently.

## 2024-03-14 NOTE — Assessment & Plan Note (Signed)
 GBS, GC/CT today. RTC weekly Reviewed labor warning signs and expectations for birth. Instructed to call office or come to hospital with persistent headache, vision changes, regular contractions, leaking of fluid, decreased fetal movement or vaginal bleeding.

## 2024-03-15 NOTE — Progress Notes (Unsigned)
    Return Prenatal Note   Subjective   28 y.o. G2P1001 at [redacted]w[redacted]d presents for this follow-up prenatal visit.  Patient reports good fetal movement. No c/o. Has ERCS scheduled for 10/13. GBS negative. Had BSUS last visit that showed cephalic presentation. Patient reports: Movement: Present No ctx.  Objective   Flow sheet Vitals: Pulse Rate: 91 BP: (!) 119/58 Fundal Height: 37 cm Fetal Heart Rate (bpm): 135 Presentation: Vertex Total weight gain: 46 lb (20.9 kg)  General Appearance  No acute distress, well appearing, and well nourished Pulmonary   Normal work of breathing Neurologic   Alert and oriented to person, place, and time Psychiatric   Mood and affect within normal limits   Assessment/Plan   Plan  28 y.o. G2P1001 at [redacted]w[redacted]d presents for follow-up OB visit. Reviewed prenatal record including previous visit note.  Pregnancy with history of cesarean section, antepartum Prev C/S x1: ERCS scheduled 10/13 (39.0wk), NO clear drape and considering ON-Q.  [redacted] weeks gestation of pregnancy -GBS neg -Continue weekly visits -Reviewed labor warning signs and expectations for birth. Instructed to call office or come to hospital with persistent headache, vision changes, regular contractions, leaking of fluid, decreased fetal movement or vaginal bleeding.    No orders of the defined types were placed in this encounter.  No follow-ups on file.   Future Appointments  Date Time Provider Department Center  03/25/2024  8:30 AM [facility] None  03/28/2024  9:35 AM Slaughterbeck, Damien, CNM [facility] None  04/10/2024  1:55 PM Leigh Sober, MD [facility] None    For next visit:  continue with routine prenatal care     Lauraine Lakes, CNM  03/21/2509:38 AM

## 2024-03-16 LAB — STREP GP B NAA: Strep Gp B NAA: NEGATIVE

## 2024-03-18 DIAGNOSIS — Z3A37 37 weeks gestation of pregnancy: Secondary | ICD-10-CM | POA: Insufficient documentation

## 2024-03-18 LAB — CERVICOVAGINAL ANCILLARY ONLY
Chlamydia: NEGATIVE
Comment: NEGATIVE
Comment: NORMAL
Neisseria Gonorrhea: NEGATIVE

## 2024-03-18 NOTE — Assessment & Plan Note (Signed)
-  GBS neg -Continue weekly visits -Reviewed labor warning signs and expectations for birth. Instructed to call office or come to hospital with persistent headache, vision changes, regular contractions, leaking of fluid, decreased fetal movement or vaginal bleeding.

## 2024-03-18 NOTE — Assessment & Plan Note (Signed)
 Prev C/S x1: ERCS scheduled 10/13 (39.0wk), NO clear drape and considering ON-Q.

## 2024-03-21 ENCOUNTER — Encounter: Payer: Self-pay | Admitting: Registered Nurse

## 2024-03-21 ENCOUNTER — Ambulatory Visit (INDEPENDENT_AMBULATORY_CARE_PROVIDER_SITE_OTHER): Admitting: Registered Nurse

## 2024-03-21 VITALS — BP 119/58 | HR 91

## 2024-03-21 DIAGNOSIS — Z3A37 37 weeks gestation of pregnancy: Secondary | ICD-10-CM

## 2024-03-21 DIAGNOSIS — Z3490 Encounter for supervision of normal pregnancy, unspecified, unspecified trimester: Secondary | ICD-10-CM

## 2024-03-21 DIAGNOSIS — O34219 Maternal care for unspecified type scar from previous cesarean delivery: Secondary | ICD-10-CM | POA: Diagnosis not present

## 2024-03-21 DIAGNOSIS — Z3483 Encounter for supervision of other normal pregnancy, third trimester: Secondary | ICD-10-CM

## 2024-03-21 NOTE — Patient Instructions (Signed)
Pain Relief During Labor and Delivery Many things can cause pain during labor and delivery, including: Pressure due to the baby moving through the pelvis. Stretching of tissues due to the baby moving through the birth canal. Muscle tension due to anxiety or nervousness. The uterus tightening (contracting)and relaxing to help move the baby. How do I get pain relief during labor and delivery?  Discuss your pain relief options with your health care provider during your prenatal visits. Explore the options offered by your hospital or birth center. There are many ways to deal with the pain of labor and delivery. You can try relaxation techniques or doing relaxing activities, taking a warm shower or bath (hydrotherapy), or other methods. There are also many medicines available to help control pain. Relaxation techniques and activities Practice relaxation techniques or do relaxing activities, such as: Focused breathing. Meditation. Visualization. Aroma therapy. Listening to your favorite music. Hypnosis. Hydrotherapy Take a warm shower or bath. This may: Provide comfort and relaxation. Lessen your feeling of pain. Reduce the amount of pain medicine needed. Shorten the length of labor. Other methods Try doing other things, such as: Getting a massage or having counterpressure on your back. Applying warm packs or ice packs. Changing positions often, moving around, or using a birthing ball. Medicines You may be given: Pain medicine through an IV or an injection into a muscle. Pain medicine inserted into your spinal column. Injections of sterile water just under the skin on your lower back. Nitrous oxide inhalation therapy, also called laughing gas. What kinds of medicine are available for pain relief? There are two kinds of medicines that can be used to relieve pain during labor and delivery: Analgesics. These medicines decrease pain without causing you to lose feeling or the ability to move  your muscles. Anesthetics. These medicines block feeling in the body and can decrease your ability to move freely. Both kinds of medicine can cause minor side effects, such as nausea, trouble concentrating, and sleepiness. They can also affect the baby's heart rate before birth and his or her breathing after birth. For this reason, health care providers are careful about when and how much medicine is given. Which medicines are used to provide pain relief? Common medicines The most common medicines used to help manage pain during labor and delivery include: Opioids. Opioids are medicines that decrease how much pain you feel (perception of pain). These medicines can be given through an IV or may be used with anesthetics to block pain. Epidural analgesia. Epidural analgesia is given through a very thin tube that is inserted into the lower back. Medicine is delivered continuously to the area near your spinal column nerves (epidural space). After having this treatment, you may be able to move your legs, but you will not be able to walk. Depending on the amount and type of medicine given, you may lose all feeling in the lower half of your body, or you may have some sensation, including the urge to push. This treatment can be used to give pain relief for a vaginal birth. Sometimes, a numbing medicine is injected into the spinal fluid when an epidural catheter is placed. This provides for immediate relief but only lasts for 1-2 hours. Once it wears off, the epidural will provide pain relief. This is called a combined spinal-epidural (CSE) block. Intrathecal analgesia (spinal analgesia). Intrathecal analgesia is similar to epidural analgesia, but the medicine is injected into the spinal fluid instead of the epidural space. It is usually only given once.  It starts to relieve pain quickly, but the pain relief lasts only 1-2 hours. Pudendal block. This block is done by injecting numbing medicine through the wall of  the vagina and into a nerve in the pelvis. Other medicines Other medicines used to help manage pain during labor and delivery include: Local anesthetics. These are used to numb a small area of the body. They may be used along with another kind of medicine or used to numb the nerves of the vagina, cervix, and perineum during the second stage of labor. Spinal block (spinal anesthesia). Spinal anesthesia is similar to spinal analgesia, but the medicine that is used contains longer-acting numbing medicines and pain medicines. This type of anesthesia can be used for a cesarean delivery and allows you to stay awake for the birth of your baby. General anesthetics cause you to lose consciousness so you do not feel pain. They are usually only used for an emergency cesarean delivery. These medicines are given through an IV or a mask or both. These medicines are used as part of a procedure or for an emergency delivery. Summary Women have many options to help them manage the pain associated with labor and delivery. You can try doing relaxing activities, taking a warm shower or bath, or other methods. There are also many medicines available to help control pain during labor and delivery. Talk with your health care provider about what options are available to you. This information is not intended to replace advice given to you by your health care provider. Make sure you discuss any questions you have with your health care provider. Document Revised: 05/30/2022 Document Reviewed: 05/30/2022 Elsevier Patient Education  2024 [employer] Signs and Symptoms of Labor Labor is the body's natural process of moving the baby and the placenta out of the uterus. The process of labor usually starts when the baby is full-term, between 39 and 41 weeks of pregnancy. Signs and symptoms that you are close to going into labor As your body prepares for labor and the birth of your baby, you may notice the following symptoms in  the weeks and days before true labor starts: Passing a small amount of thick, bloody mucus from your vagina. This is called normal bloody show or losing your mucus plug. This may happen more than a week before labor begins, or right before labor begins, as the opening of the cervix starts to widen (dilate). For some women, the entire mucus plug passes at once. For others, pieces of the mucus plug may gradually pass over several days. Your baby moving (dropping) lower in your pelvis to get into position for birth (lightening). When this happens, you may feel more pressure on your bladder and pelvic bone and less pressure on your ribs. This may make it easier to breathe. It may also cause you to need to urinate more often and have problems with bowel movements. Having "practice contractions," also called Braxton Hicks contractions or false labor. These occur at irregular (unevenly spaced) intervals that are more than 10 minutes apart. False labor contractions are common after exercise or sexual activity. They will stop if you change position, rest, or drink fluids. These contractions are usually mild and do not get stronger over time. They may feel like: A backache or back pain. Mild cramps, similar to menstrual cramps. Tightening or pressure in your abdomen. Other early symptoms include: Nausea or loss of appetite. Diarrhea. Having a sudden burst of energy, or feeling very tired. Mood changes. Having trouble  sleeping. Signs and symptoms that labor has begun Signs that you are in labor may include: Having contractions that come at regular (evenly spaced) intervals and increase in intensity. This may feel like more intense tightening or pressure in your abdomen that moves to your back. Contractions may also feel like rhythmic pain in your upper thighs or back that comes and goes at regular intervals. If you are delivering for the first time, this change in intensity of contractions often occurs at a  more gradual pace. If you have given birth before, you may notice a more rapid progression of contraction changes. Feeling pressure in the vaginal area. Your water breaking (rupture of membranes). This is when the sac of fluid that surrounds your baby breaks. Fluid leaking from your vagina may be clear or blood-tinged. Labor usually starts within 24 hours of your water breaking, but it may take longer to begin. Some people may feel a sudden gush of fluid; others may notice repeatedly damp underwear. Follow these instructions at home:  When labor starts, or if your water breaks, call your health care provider or nurse care line. Based on your situation, they will determine when you should go in for an exam. During early labor, you may be able to rest and manage symptoms at home. Some strategies to try at home include: Breathing and relaxation techniques. Taking a warm bath or shower. Listening to music. Using a heating pad on the lower back for pain. If directed, apply heat to the area as often as told by your health care provider. Use the heat source that your health care provider recommends, such as a moist heat pack or a heating pad. Place a towel between your skin and the heat source. Leave the heat on for 20-30 minutes. Remove the heat if your skin turns bright red. This is especially important if you are unable to feel pain, heat, or cold. You have a greater risk of getting burned. Contact a health care provider if: Your labor has started. Your water breaks. You have nausea, vomiting, or diarrhea. Get help right away if: You have painful, regular contractions that are 5 minutes apart or less. Labor starts before you are [redacted] weeks along in your pregnancy. You have a fever. You have bright red blood coming from your vagina. You do not feel your baby moving. You have a severe headache with or without vision problems. You have chest pain or shortness of breath. These symptoms may  represent a serious problem that is an emergency. Do not wait to see if the symptoms will go away. Get medical help right away. Call your local emergency services (911 in the [place] Do not drive yourself to the hospital. Summary Labor is your body's natural process of moving your baby and the placenta out of your uterus. The process of labor usually starts when your baby is full-term, between 47 and 40 weeks of pregnancy. When labor starts, or if your water breaks, call your health care provider or nurse care line. Based on your situation, they will determine when you should go in for an exam. This information is not intended to replace advice given to you by your health care provider. Make sure you discuss any questions you have with your health care provider. Document Revised: 10/20/2020 Document Reviewed: 10/20/2020 Elsevier Patient Education  2024 [employer].

## 2024-03-25 ENCOUNTER — Encounter
Admission: RE | Admit: 2024-03-25 | Discharge: 2024-03-25 | Disposition: A | Discharge status: Home / Self Care | Source: Ambulatory Visit | Attending: Obstetrics | Admitting: Obstetrics

## 2024-03-25 ENCOUNTER — Other Ambulatory Visit: Payer: Self-pay

## 2024-03-25 DIAGNOSIS — O34219 Maternal care for unspecified type scar from previous cesarean delivery: Secondary | ICD-10-CM

## 2024-03-25 DIAGNOSIS — Z01812 Encounter for preprocedural laboratory examination: Secondary | ICD-10-CM

## 2024-03-25 NOTE — Patient Instructions (Addendum)
 Your procedure is scheduled on: 04/01/24 - Monday  Arrival Time: Please call Labor and Delivery the day before your scheduled C-Section to find out your arrival time. [phone].  Arrival: If your arrival time is prior to 6:00 am, please enter through the Emergency Room Entrance and you will be directed to Labor and Delivery. If your arrival time is 6:00 am or later, please enter the Medical Mall and follow the greeter's instructions.  REMEMBER: Instructions that are not followed completely may result in serious medical risk, up to and including death; or upon the discretion of your surgeon and anesthesiologist your surgery may need to be rescheduled.  Do not eat food or drink any liquids after midnight the night before surgery.  No gum chewing or hard candies.  One week prior to surgery: Stop Anti-inflammatories (NSAIDS) such as Advil , Aleve, Ibuprofen , Motrin , Naproxen, Naprosyn and Aspirin based products such as Excedrin, Goody's Powder, BC Powder. You may take Tylenol  if needed for pain up until the day of surgery.  Stop ANY OVER THE COUNTER supplements until after surgery.  ON THE DAY OF SURGERY ONLY TAKE THESE MEDICATIONS WITH SIPS OF WATER:  none   No Alcohol for 24 hours before or after surgery.  No Smoking including e-cigarettes for 24 hours prior to surgery.  No chewable tobacco products for at least 6 hours prior to surgery.  No nicotine  patches on the day of surgery.  Do not use any recreational drugs for at least a week prior to your surgery.  Please be advised that the combination of cocaine and anesthesia may have negative outcomes, up to and including death. If you test positive for cocaine, your surgery will be cancelled.  On the morning of surgery brush your teeth with toothpaste and water, you may rinse your mouth with mouthwash if you wish. Do not swallow any toothpaste or mouthwash.  Use CHG wipes as directed on instruction sheet.  Do not wear jewelry,  make-up, hairpins, clips or nail polish.  For welded (permanent) jewelry: bracelets, anklets, waist bands, etc.  Please have this removed prior to surgery.  If it is not removed, there is a chance that hospital personnel will need to cut it off on the day of surgery.  Do not wear lotions, powders, or perfumes.   Do not shave body hair from the neck down 48 hours before surgery.  Contact lenses, hearing aids and dentures may not be worn into surgery.  Do not bring valuables to the hospital. [facility] is not responsible for any missing/lost belongings or valuables.   Notify your doctor if there is any change in your medical condition (cold, fever, infection).  Wear comfortable clothing (specific to your surgery type) to the hospital.  After surgery, you can help prevent lung complications by doing breathing exercises.  Take deep breaths and cough every 1-2 hours. Your doctor may order a device called an Incentive Spirometer to help you take deep breaths.  When coughing or sneezing, hold a pillow firmly against your incision with both hands. This is called "splinting." Doing this helps protect your incision. It also decreases belly discomfort.  Please call the Pre-admissions Testing Dept. at [phone] if you have any questions about these instructions.  Surgery Visitation Policy:  Visitor Passes   All visitors, including children, need an identification sticker when visiting. These stickers must be worn where they can be seen.   Labor & Delivery  Laboring women may have one designated support person and two other  visitors of any age visit. The support person must remain the same. The visitors may switch with other visitors. Visitation is permitted 24 hours per day. The designated support person or a visitor over the age of 16 may sleep overnight in the patient's room. A doula registered with [facility] for labor and delivery support is not considered a visitor. Doulas not  registered with [facility] are considered visitors.  Mother Baby Unit, OB Specialty and Gynecological Care  A designated support person and three visitors of any age may visit. The three visitors may switch out. The designated support person or a visitor age 17 or older may stay overnight in the room. During the postpartum period (up to 6 weeks), if the mother is the patient, she can have her newborn stay with her if there is another support person present who can be responsible for the baby.   Preparing the Skin Before Surgery  To help prevent the risk of infection at your surgical site, we are now providing you with rinse-free Sage 2% Chlorhexidine Gluconate (CHG) disposable wipes.  Chlorhexidine Gluconate (CHG) Soap  o An antiseptic cleaner that kills germs and bonds with the skin to continue killing germs even after washing  o Used for showering the night before surgery and morning of surgery  The night before surgery: Shower or bathe with warm water. Do not apply perfume, lotions, powders. Wait one hour after shower. Skin should be dry and cool. Open Sage wipe package - use 6 disposable cloths. Wipe body using one cloth for the right arm, one cloth for the left arm, one cloth for the right leg, one cloth for the left leg, one cloth for the chest/abdomen area, and one cloth for the back. Do not use on open wounds or sores. Do not use on face or genitals (private parts). If you are breast feeding, do not use on breasts. 5. Do not rinse, allow to dry. 6. Skin may feel tacky for several minutes. 7. Dress in clean clothes. 8. Place clean sheets on your bed and do not sleep with pets.  REPEAT ABOVE ON THE MORNING OF SURGERY BEFORE ARRIVING TO THE HOSPITAL.  How to Use an Incentive Spirometer  An incentive spirometer is a tool that measures how well you are filling your lungs with each breath. Learning to take long, deep breaths using this tool can help you keep your lungs clear  and active. This may help to reverse or lessen your chance of developing breathing (pulmonary) problems, especially infection. You may be asked to use a spirometer: After a surgery. If you have a lung problem or a history of smoking. After a long period of time when you have been unable to move or be active. If the spirometer includes an indicator to show the highest number that you have reached, your health care provider or respiratory therapist will help you set a goal. Keep a log of your progress as told by your health care provider. What are the risks? Breathing too quickly may cause dizziness or cause you to pass out. Take your time so you do not get dizzy or light-headed. If you are in pain, you may need to take pain medicine before doing incentive spirometry. It is harder to take a deep breath if you are having pain. How to use your incentive spirometer  Sit up on the edge of your bed or on a chair. Hold the incentive spirometer so that it is in an upright position. Before  you use the spirometer, breathe out normally. Place the mouthpiece in your mouth. Make sure your lips are closed tightly around it. Breathe in slowly and as deeply as you can through your mouth, causing the piston or the ball to rise toward the top of the chamber. Hold your breath for 3-5 seconds, or for as long as possible. If the spirometer includes a coach indicator, use this to guide you in breathing. Slow down your breathing if the indicator goes above the marked areas. Remove the mouthpiece from your mouth and breathe out normally. The piston or ball will return to the bottom of the chamber. Rest for a few seconds, then repeat the steps 10 or more times. Take your time and take a few normal breaths between deep breaths so that you do not get dizzy or light-headed. Do this every 1-2 hours when you are awake. If the spirometer includes a goal marker to show the highest number you have reached (best effort), use this  as a goal to work toward during each repetition. After each set of 10 deep breaths, cough a few times. This will help to make sure that your lungs are clear. If you have an incision on your chest or abdomen from surgery, place a pillow or a rolled-up towel firmly against the incision when you cough. This can help to reduce pain while taking deep breaths and coughing. General tips When you are able to get out of bed: Walk around often. Continue to take deep breaths and cough in order to clear your lungs. Keep using the incentive spirometer until your health care provider says it is okay to stop using it. If you have been in the hospital, you may be told to keep using the spirometer at home. Contact a health care provider if: You are having difficulty using the spirometer. You have trouble using the spirometer as often as instructed. Your pain medicine is not giving enough relief for you to use the spirometer as told. You have a fever. Get help right away if: You develop shortness of breath. You develop a cough with bloody mucus from the lungs. You have fluid or blood coming from an incision site after you cough. Summary An incentive spirometer is a tool that can help you learn to take long, deep breaths to keep your lungs clear and active. You may be asked to use a spirometer after a surgery, if you have a lung problem or a history of smoking, or if you have been inactive for a long period of time. Use your incentive spirometer as instructed every 1-2 hours while you are awake. If you have an incision on your chest or abdomen, place a pillow or a rolled-up towel firmly against your incision when you cough. This will help to reduce pain. Get help right away if you have shortness of breath, you cough up bloody mucus, or blood comes from your incision when you cough. This information is not intended to replace advice given to you by your health care provider. Make sure you discuss any questions  you have with your health care provider. Document Revised: 08/26/2019 Document Reviewed: 08/26/2019 Elsevier Patient Education  2023 [employer].

## 2024-03-27 NOTE — Progress Notes (Deleted)
    Return Prenatal Note   Subjective   28 y.o. G2P1001 at [redacted]w[redacted]d presents for this follow-up prenatal visit.  Patient *** Patient reports:    Objective   Flow sheet Vitals:   Total weight gain: 46 lb (20.9 kg)  General Appearance  No acute distress, well appearing, and well nourished Pulmonary   Normal work of breathing Neurologic   Alert and oriented to person, place, and time Psychiatric   Mood and affect within normal limits   Assessment/Plan   Plan  28 y.o. G2P1001 at [redacted]w[redacted]d presents for follow-up OB visit. Reviewed prenatal record including previous visit note.  No problem-specific Assessment & Plan notes found for this encounter.      No orders of the defined types were placed in this encounter.  No follow-ups on file.   Future Appointments  Date Time Provider Department Center  03/28/2024  9:35 AM Slaughterbeck, Damien, CNM [facility] None  03/29/2024  9:00 AM [facility] None  04/10/2024  1:55 PM Leigh Sober, MD [facility] None    For next visit:  continue with routine prenatal care     Damien Parsley, CNM [facility]

## 2024-03-28 ENCOUNTER — Encounter: Admitting: Certified Nurse Midwife

## 2024-03-28 DIAGNOSIS — Z3A38 38 weeks gestation of pregnancy: Secondary | ICD-10-CM

## 2024-03-28 DIAGNOSIS — Z3483 Encounter for supervision of other normal pregnancy, third trimester: Secondary | ICD-10-CM

## 2024-03-29 ENCOUNTER — Encounter
Admission: RE | Admit: 2024-03-29 | Discharge: 2024-03-29 | Disposition: A | Discharge status: Home / Self Care | Source: Ambulatory Visit | Attending: Obstetrics | Admitting: Obstetrics

## 2024-03-29 DIAGNOSIS — Z01818 Encounter for other preprocedural examination: Secondary | ICD-10-CM | POA: Diagnosis present

## 2024-03-29 DIAGNOSIS — O34219 Maternal care for unspecified type scar from previous cesarean delivery: Secondary | ICD-10-CM | POA: Insufficient documentation

## 2024-03-29 DIAGNOSIS — Z01812 Encounter for preprocedural laboratory examination: Secondary | ICD-10-CM | POA: Insufficient documentation

## 2024-03-29 DIAGNOSIS — Z3A38 38 weeks gestation of pregnancy: Secondary | ICD-10-CM | POA: Insufficient documentation

## 2024-03-29 LAB — CBC
HCT: 34.1 % — ABNORMAL LOW (ref 36.0–46.0)
Hemoglobin: 10.9 g/dL — ABNORMAL LOW (ref 12.0–15.0)
MCH: 26 pg (ref 26.0–34.0)
MCHC: 32 g/dL (ref 30.0–36.0)
MCV: 81.4 fL (ref 80.0–100.0)
Platelets: 305 K/uL (ref 150–400)
RBC: 4.19 MIL/uL (ref 3.87–5.11)
RDW: 15 % (ref 11.5–15.5)
WBC: 10.8 K/uL — ABNORMAL HIGH (ref 4.0–10.5)
nRBC: 0 % (ref 0.0–0.2)

## 2024-03-29 LAB — RAPID HIV SCREEN (HIV 1/2 AB+AG)
HIV 1/2 Antibodies: NONREACTIVE
HIV-1 P24 Antigen - HIV24: NONREACTIVE

## 2024-03-30 LAB — RPR: RPR Ser Ql: NONREACTIVE

## 2024-04-01 ENCOUNTER — Other Ambulatory Visit: Payer: Self-pay

## 2024-04-01 ENCOUNTER — Encounter
Admission: RE | Disposition: A | Discharge status: Home / Self Care | Payer: Self-pay | Source: Home / Self Care | Attending: Obstetrics

## 2024-04-01 ENCOUNTER — Inpatient Hospital Stay
Admission: RE | Admit: 2024-04-01 | Discharge: 2024-04-03 | DRG: 788 | Disposition: A | Attending: Obstetrics | Admitting: Obstetrics

## 2024-04-01 ENCOUNTER — Inpatient Hospital Stay: Payer: Self-pay | Admitting: Certified Registered Nurse Anesthetist

## 2024-04-01 ENCOUNTER — Encounter: Payer: Self-pay | Admitting: Obstetrics

## 2024-04-01 ENCOUNTER — Inpatient Hospital Stay: Payer: Self-pay | Admitting: Urgent Care

## 2024-04-01 DIAGNOSIS — O9902 Anemia complicating childbirth: Secondary | ICD-10-CM | POA: Diagnosis present

## 2024-04-01 DIAGNOSIS — F17210 Nicotine dependence, cigarettes, uncomplicated: Secondary | ICD-10-CM | POA: Diagnosis present

## 2024-04-01 DIAGNOSIS — Z3490 Encounter for supervision of normal pregnancy, unspecified, unspecified trimester: Secondary | ICD-10-CM

## 2024-04-01 DIAGNOSIS — Z3493 Encounter for supervision of normal pregnancy, unspecified, third trimester: Principal | ICD-10-CM

## 2024-04-01 DIAGNOSIS — O34211 Maternal care for low transverse scar from previous cesarean delivery: Secondary | ICD-10-CM | POA: Diagnosis present

## 2024-04-01 DIAGNOSIS — O34219 Maternal care for unspecified type scar from previous cesarean delivery: Secondary | ICD-10-CM

## 2024-04-01 DIAGNOSIS — Z3A39 39 weeks gestation of pregnancy: Secondary | ICD-10-CM

## 2024-04-01 DIAGNOSIS — O99334 Smoking (tobacco) complicating childbirth: Secondary | ICD-10-CM | POA: Diagnosis present

## 2024-04-01 LAB — URINE DRUG SCREEN, QUALITATIVE (ARMC ONLY)
Amphetamines, Ur Screen: NOT DETECTED
Barbiturates, Ur Screen: NOT DETECTED
Benzodiazepine, Ur Scrn: NOT DETECTED
Cannabinoid 50 Ng, Ur ~~LOC~~: NOT DETECTED
Cocaine Metabolite,Ur ~~LOC~~: NOT DETECTED
MDMA (Ecstasy)Ur Screen: NOT DETECTED
Methadone Scn, Ur: NOT DETECTED
Opiate, Ur Screen: NOT DETECTED
Phencyclidine (PCP) Ur S: NOT DETECTED
Tricyclic, Ur Screen: NOT DETECTED

## 2024-04-01 LAB — CBC
HCT: 34.8 % — ABNORMAL LOW (ref 36.0–46.0)
Hemoglobin: 11.1 g/dL — ABNORMAL LOW (ref 12.0–15.0)
MCH: 25.8 pg — ABNORMAL LOW (ref 26.0–34.0)
MCHC: 31.9 g/dL (ref 30.0–36.0)
MCV: 80.7 fL (ref 80.0–100.0)
Platelets: 338 K/uL (ref 150–400)
RBC: 4.31 MIL/uL (ref 3.87–5.11)
RDW: 15.2 % (ref 11.5–15.5)
WBC: 8.9 K/uL (ref 4.0–10.5)
nRBC: 0 % (ref 0.0–0.2)

## 2024-04-01 LAB — ABO/RH: ABO/RH(D): A NEG

## 2024-04-01 SURGERY — Surgical Case
Anesthesia: Spinal

## 2024-04-01 MED ORDER — LIDOCAINE 5 % EX PTCH
MEDICATED_PATCH | CUTANEOUS | Status: DC | PRN
  Administered 2024-04-01: 1 via TRANSDERMAL

## 2024-04-01 MED ORDER — POVIDONE-IODINE 10 % EX SWAB
2.0000 | Freq: Once | CUTANEOUS | Status: DC
  Administered 2024-04-01: 2 via TOPICAL

## 2024-04-01 MED ORDER — LACTATED RINGERS IV SOLN: INTRAVENOUS | Status: AC

## 2024-04-01 MED ORDER — SOD CITRATE-CITRIC ACID 500-334 MG/5ML PO SOLN
ORAL | Status: AC
  Filled 2024-04-01: qty 15

## 2024-04-01 MED ORDER — OXYCODONE HCL 5 MG PO TABS: 5.0000 mg | ORAL_TABLET | Freq: Four times a day (QID) | ORAL | Status: DC | PRN

## 2024-04-01 MED ORDER — ACETAMINOPHEN 160 MG/5ML PO SOLN
1000.0000 mg | Freq: Four times a day (QID) | ORAL | Status: DC
  Administered 2024-04-01 – 2024-04-03 (×5): 1000 mg via ORAL
  Filled 2024-04-01 (×9): qty 40.6

## 2024-04-01 MED ORDER — KETOROLAC TROMETHAMINE 30 MG/ML IJ SOLN: 30.0000 mg | Freq: Four times a day (QID) | INTRAMUSCULAR | Status: DC | PRN

## 2024-04-01 MED ORDER — MENTHOL 3 MG MT LOZG: 1.0000 | LOZENGE | OROMUCOSAL | Status: DC | PRN

## 2024-04-01 MED ORDER — LACTATED RINGERS IV SOLN: INTRAVENOUS | Status: DC

## 2024-04-01 MED ORDER — WITCH HAZEL-GLYCERIN EX PADS: 1.0000 | MEDICATED_PAD | CUTANEOUS | Status: DC | PRN

## 2024-04-01 MED ORDER — PHENYLEPHRINE HCL-NACL 20-0.9 MG/250ML-% IV SOLN
INTRAVENOUS | Status: AC
  Filled 2024-04-01: qty 250

## 2024-04-01 MED ORDER — KETOROLAC TROMETHAMINE 30 MG/ML IJ SOLN: 30.0000 mg | Freq: Four times a day (QID) | INTRAMUSCULAR | Status: AC

## 2024-04-01 MED ORDER — ONDANSETRON HCL 4 MG/2ML IJ SOLN
4.0000 mg | Freq: Three times a day (TID) | INTRAMUSCULAR | Status: DC | PRN
Start: 2024-04-01 — End: 2024-04-03
  Administered 2024-04-01: 4 mg via INTRAVENOUS
  Filled 2024-04-01: qty 2

## 2024-04-01 MED ORDER — GLYCOPYRROLATE 0.2 MG/ML IJ SOLN
INTRAMUSCULAR | Status: AC
  Filled 2024-04-01: qty 1

## 2024-04-01 MED ORDER — DEXMEDETOMIDINE HCL IN NACL 80 MCG/20ML IV SOLN
INTRAVENOUS | Status: AC
  Filled 2024-04-01: qty 20

## 2024-04-01 MED ORDER — DIPHENHYDRAMINE HCL 25 MG PO CAPS: 25.0000 mg | ORAL_CAPSULE | ORAL | Status: DC | PRN

## 2024-04-01 MED ORDER — DEXAMETHASONE SODIUM PHOSPHATE 4 MG/ML IJ SOLN
INTRAMUSCULAR | Status: DC | PRN
  Administered 2024-04-01: 10 mg via INTRAVENOUS

## 2024-04-01 MED ORDER — OXYTOCIN-SODIUM CHLORIDE 30-0.9 UT/500ML-% IV SOLN
2.5000 [IU]/h | INTRAVENOUS | Status: AC
  Administered 2024-04-01 (×2): 2.5 [IU]/h via INTRAVENOUS
  Filled 2024-04-01: qty 500

## 2024-04-01 MED ORDER — BUPIVACAINE IN DEXTROSE 0.75-8.25 % IT SOLN
INTRATHECAL | Status: DC | PRN
  Administered 2024-04-01: 1.4 mL via INTRATHECAL

## 2024-04-01 MED ORDER — PHENYLEPHRINE HCL-NACL 20-0.9 MG/250ML-% IV SOLN
INTRAVENOUS | Status: DC | PRN
  Administered 2024-04-01: 50 ug/min via INTRAVENOUS

## 2024-04-01 MED ORDER — ACETAMINOPHEN 500 MG PO TABS
1000.0000 mg | ORAL_TABLET | ORAL | Status: AC
  Administered 2024-04-01: 1000 mg via ORAL
  Filled 2024-04-01: qty 2

## 2024-04-01 MED ORDER — MORPHINE SULFATE (PF) 0.5 MG/ML IJ SOLN
INTRAMUSCULAR | Status: DC | PRN
  Administered 2024-04-01: 100 ug via INTRATHECAL

## 2024-04-01 MED ORDER — FENTANYL CITRATE (PF) 100 MCG/2ML IJ SOLN
INTRAMUSCULAR | Status: DC | PRN
  Administered 2024-04-01: 15 ug via INTRATHECAL

## 2024-04-01 MED ORDER — MEPERIDINE HCL 25 MG/ML IJ SOLN: 6.2500 mg | INTRAMUSCULAR | Status: DC | PRN

## 2024-04-01 MED ORDER — OXYTOCIN-SODIUM CHLORIDE 30-0.9 UT/500ML-% IV SOLN
INTRAVENOUS | Status: AC
  Filled 2024-04-01: qty 1000

## 2024-04-01 MED ORDER — SODIUM CHLORIDE 0.9 % IV SOLN
12.5000 mg | INTRAVENOUS | Status: DC | PRN
  Administered 2024-04-01: 12.5 mg via INTRAVENOUS
  Filled 2024-04-01: qty 12.5

## 2024-04-01 MED ORDER — LACTATED RINGERS IV BOLUS
1000.0000 mL | Freq: Once | INTRAVENOUS | Status: AC
  Administered 2024-04-01: 1000 mL via INTRAVENOUS

## 2024-04-01 MED ORDER — ONDANSETRON HCL 4 MG/2ML IJ SOLN
INTRAMUSCULAR | Status: AC
  Filled 2024-04-01: qty 2

## 2024-04-01 MED ORDER — EPHEDRINE SULFATE-NACL 50-0.9 MG/10ML-% IV SOSY
PREFILLED_SYRINGE | INTRAVENOUS | Status: DC | PRN
  Administered 2024-04-01 (×2): 5 mg via INTRAVENOUS
  Administered 2024-04-01: 10 mg via INTRAVENOUS
  Administered 2024-04-01 (×2): 5 mg via INTRAVENOUS

## 2024-04-01 MED ORDER — LACTATED RINGERS IV SOLN: Freq: Once | INTRAVENOUS | Status: AC

## 2024-04-01 MED ORDER — KETOROLAC TROMETHAMINE 30 MG/ML IJ SOLN
INTRAMUSCULAR | Status: DC | PRN
  Administered 2024-04-01: 30 mg via INTRAVENOUS

## 2024-04-01 MED ORDER — DIPHENHYDRAMINE HCL 12.5 MG/5ML PO ELIX: 25.0000 mg | ORAL_SOLUTION | Freq: Three times a day (TID) | ORAL | Status: DC | PRN

## 2024-04-01 MED ORDER — DIBUCAINE (PERIANAL) 1 % EX OINT: 1.0000 | TOPICAL_OINTMENT | CUTANEOUS | Status: DC | PRN

## 2024-04-01 MED ORDER — SIMETHICONE 80 MG PO CHEW: 80.0000 mg | CHEWABLE_TABLET | ORAL | Status: DC | PRN

## 2024-04-01 MED ORDER — OXYTOCIN-SODIUM CHLORIDE 30-0.9 UT/500ML-% IV SOLN
INTRAVENOUS | Status: DC | PRN
  Administered 2024-04-01: 500 mL via INTRAVENOUS

## 2024-04-01 MED ORDER — LIDOCAINE HCL (PF) 1 % IJ SOLN
INTRAMUSCULAR | Status: DC | PRN
Start: 2024-04-01 — End: 2024-04-01
  Administered 2024-04-01 (×3): 2 mL via SUBCUTANEOUS

## 2024-04-01 MED ORDER — FENTANYL CITRATE (PF) 100 MCG/2ML IJ SOLN: 25.0000 ug | INTRAMUSCULAR | Status: DC | PRN

## 2024-04-01 MED ORDER — LIDOCAINE HCL (PF) 2 % IJ SOLN
INTRAMUSCULAR | Status: AC
  Filled 2024-04-01: qty 5

## 2024-04-01 MED ORDER — CHLORHEXIDINE GLUCONATE 0.12 % MT SOLN
15.0000 mL | Freq: Once | OROMUCOSAL | Status: AC
  Administered 2024-04-01: 15 mL via OROMUCOSAL
  Filled 2024-04-01: qty 15

## 2024-04-01 MED ORDER — ORAL CARE MOUTH RINSE: 15.0000 mL | Freq: Once | OROMUCOSAL | Status: AC

## 2024-04-01 MED ORDER — ONDANSETRON HCL 4 MG/2ML IJ SOLN
INTRAMUSCULAR | Status: DC | PRN
  Administered 2024-04-01: 4 mg via INTRAVENOUS

## 2024-04-01 MED ORDER — SENNOSIDES-DOCUSATE SODIUM 8.6-50 MG PO TABS
2.0000 | ORAL_TABLET | Freq: Every day | ORAL | Status: DC
  Administered 2024-04-02 – 2024-04-03 (×2): 2 via ORAL
  Filled 2024-04-01 (×2): qty 2

## 2024-04-01 MED ORDER — FENTANYL CITRATE (PF) 100 MCG/2ML IJ SOLN
INTRAMUSCULAR | Status: AC
  Filled 2024-04-01: qty 2

## 2024-04-01 MED ORDER — CEFAZOLIN SODIUM-DEXTROSE 2-4 GM/100ML-% IV SOLN
2.0000 g | INTRAVENOUS | Status: AC
  Administered 2024-04-01: 2 g via INTRAVENOUS
  Filled 2024-04-01: qty 100

## 2024-04-01 MED ORDER — OXYCODONE HCL 5 MG/5ML PO SOLN
5.0000 mg | ORAL | Status: DC | PRN
  Filled 2024-04-01: qty 5

## 2024-04-01 MED ORDER — NALOXONE HCL 4 MG/10ML IJ SOLN: 1.0000 ug/kg/h | INTRAVENOUS | Status: DC | PRN

## 2024-04-01 MED ORDER — LIDOCAINE 5 % EX PTCH
MEDICATED_PATCH | CUTANEOUS | Status: AC
  Filled 2024-04-01: qty 1

## 2024-04-01 MED ORDER — IBUPROFEN 100 MG/5ML PO SUSP
800.0000 mg | Freq: Three times a day (TID) | ORAL | Status: DC
  Filled 2024-04-01 (×4): qty 40

## 2024-04-01 MED ORDER — METHYLERGONOVINE MALEATE 0.2 MG/ML IJ SOLN
INTRAMUSCULAR | Status: AC
  Filled 2024-04-01: qty 1

## 2024-04-01 MED ORDER — MORPHINE SULFATE (PF) 0.5 MG/ML IJ SOLN
INTRAMUSCULAR | Status: AC
  Filled 2024-04-01: qty 10

## 2024-04-01 MED ORDER — DIPHENHYDRAMINE HCL 50 MG/ML IJ SOLN: 12.5000 mg | INTRAMUSCULAR | Status: DC | PRN

## 2024-04-01 MED ORDER — NALOXONE HCL 0.4 MG/ML IJ SOLN: 0.4000 mg | INTRAMUSCULAR | Status: DC | PRN

## 2024-04-01 MED ORDER — PHENYLEPHRINE 80 MCG/ML (10ML) SYRINGE FOR IV PUSH (FOR BLOOD PRESSURE SUPPORT)
PREFILLED_SYRINGE | INTRAVENOUS | Status: AC
  Filled 2024-04-01: qty 10

## 2024-04-01 MED ORDER — KETOROLAC TROMETHAMINE 30 MG/ML IJ SOLN
30.0000 mg | Freq: Four times a day (QID) | INTRAMUSCULAR | Status: AC
  Administered 2024-04-01 – 2024-04-02 (×4): 30 mg via INTRAVENOUS
  Filled 2024-04-01 (×4): qty 1

## 2024-04-01 MED ORDER — SOD CITRATE-CITRIC ACID 500-334 MG/5ML PO SOLN
30.0000 mL | ORAL | Status: AC
Start: 2024-04-01 — End: 2024-04-01
  Administered 2024-04-01: 30 mL via ORAL

## 2024-04-01 MED ORDER — PRENATAL MULTIVITAMIN CH
1.0000 | ORAL_TABLET | Freq: Every day | ORAL | Status: DC
  Administered 2024-04-02 – 2024-04-03 (×2): 1 via ORAL
  Filled 2024-04-01 (×2): qty 1

## 2024-04-01 MED ORDER — SODIUM CHLORIDE 0.9% FLUSH: 3.0000 mL | INTRAVENOUS | Status: DC | PRN

## 2024-04-01 MED ORDER — SIMETHICONE 80 MG PO CHEW
80.0000 mg | CHEWABLE_TABLET | Freq: Three times a day (TID) | ORAL | Status: DC
  Administered 2024-04-02 – 2024-04-03 (×4): 80 mg via ORAL
  Filled 2024-04-01 (×4): qty 1

## 2024-04-01 MED ORDER — COCONUT OIL OIL: 1.0000 | TOPICAL_OIL | Status: DC | PRN

## 2024-04-01 SURGICAL SUPPLY — 39 items
BAG COUNTER SPONGE SURGICOUNT (BAG) ×1 IMPLANT
BENZOIN TINCTURE PRP APPL 2/3 (GAUZE/BANDAGES/DRESSINGS) IMPLANT
CHLORAPREP W/TINT 26 (MISCELLANEOUS) ×2 IMPLANT
DERMABOND ADVANCED .7 DNX12 (GAUZE/BANDAGES/DRESSINGS) IMPLANT
DRESSING PEEL AND PLAC PRVNA20 (GAUZE/BANDAGES/DRESSINGS) IMPLANT
DRSG TELFA 3X8 NADH STRL (GAUZE/BANDAGES/DRESSINGS) ×1 IMPLANT
ELECTRODE REM PT RTRN 9FT ADLT (ELECTROSURGICAL) ×1 IMPLANT
GAUZE SPONGE 4X4 12PLY STRL (GAUZE/BANDAGES/DRESSINGS) ×1 IMPLANT
GLOVE BIO SURGEON STRL SZ 6 (GLOVE) ×1 IMPLANT
GLOVE BIOGEL M STER SZ 6 (GLOVE) IMPLANT
GLOVE BIOGEL PI IND STRL 6 (GLOVE) ×1 IMPLANT
GLOVE BIOGEL PI IND STRL 7.0 (GLOVE) IMPLANT
GLOVE BIOGEL PI IND STRL 7.5 (GLOVE) IMPLANT
GLOVE SURG LATEX 7.5 PF (GLOVE) IMPLANT
GLOVE SURG SYN 6.5 PF PI BL (GLOVE) IMPLANT
GOWN STRL REUS W/ TWL LRG LVL3 (GOWN DISPOSABLE) ×2 IMPLANT
KIT TURNOVER KIT A (KITS) ×1 IMPLANT
MANIFOLD NEPTUNE II (INSTRUMENTS) ×1 IMPLANT
MAT PREVALON FULL STRYKER (MISCELLANEOUS) ×1 IMPLANT
PACK C SECTION AR (MISCELLANEOUS) ×1 IMPLANT
PAD OB MATERNITY 11 LF (PERSONAL CARE ITEMS) ×1 IMPLANT
PAD PREP OB/GYN DISP 24X41 (PERSONAL CARE ITEMS) ×1 IMPLANT
RETRACTOR TRAXI PANNICULUS (MISCELLANEOUS) IMPLANT
RTRCTR C-SECT PINK 25CM LRG (MISCELLANEOUS) IMPLANT
SCRUB CHG 4% DYNA-HEX 4OZ (MISCELLANEOUS) ×1 IMPLANT
SOLN 0.9% NACL 1000 ML (IV SOLUTION) ×1 IMPLANT
SOLN 0.9% NACL POUR BTL 1000ML (IV SOLUTION) ×1 IMPLANT
STRIP CLOSURE SKIN 1/2X4 (GAUZE/BANDAGES/DRESSINGS) IMPLANT
SUT MNCRL AB 4-0 PS2 18 (SUTURE) ×1 IMPLANT
SUT MON AB 3-0 SH 27 (SUTURE) IMPLANT
SUT PLAIN GUT 0 (SUTURE) IMPLANT
SUT VIC AB 0 CT1 36 (SUTURE) ×4 IMPLANT
SUT VIC AB 0 CTX36XBRD ANBCTRL (SUTURE) IMPLANT
SUT VIC AB 1 CT1 36 (SUTURE) IMPLANT
SUT VIC AB 3-0 SH 27X BRD (SUTURE) ×1 IMPLANT
SUT VIC AB 4-0 KS 27 (SUTURE) IMPLANT
TAPE PAPER 3X10 WHT MICROPORE (GAUZE/BANDAGES/DRESSINGS) IMPLANT
TRAP FLUID SMOKE EVACUATOR (MISCELLANEOUS) ×1 IMPLANT
WATER STERILE IRR 500ML POUR (IV SOLUTION) ×1 IMPLANT

## 2024-04-01 NOTE — Anesthesia Preprocedure Evaluation (Signed)
 Anesthesia Evaluation  Patient identified by MRN, date of birth, ID band Patient awake    Reviewed: Allergy & Precautions, H&P , NPO status , Patient's Chart, lab work & pertinent test results, reviewed documented beta blocker date and time   History of Anesthesia Complications Negative for: history of anesthetic complications  Airway Mallampati: III  TM Distance: >3 FB Neck ROM: full    Dental  (+) Teeth Intact, Dental Advidsory Given, Missing   Pulmonary neg shortness of breath, neg COPD, neg recent URI, Current Smoker   Pulmonary exam normal breath sounds clear to auscultation       Cardiovascular Exercise Tolerance: Good negative cardio ROS  Rhythm:regular Rate:Normal     Neuro/Psych negative neurological ROS  negative psych ROS   GI/Hepatic negative GI ROS, Neg liver ROS,,,  Endo/Other  negative endocrine ROSneg diabetes    Renal/GU      Musculoskeletal   Abdominal   Peds  Hematology negative hematology ROS (+) Blood dyscrasia, anemia   Anesthesia Other Findings Past Medical History: No date: Anemia No date: Constipation during pregnancy No date: Late prenatal care     Comment:  at 24 wks No date: Marijuana use     Comment:  2 wks ago from today 07/01/17 No date: Medical history non-contributory 06/28/2017: No prenatal care in current pregnancy in third trimester No date: Noncompliance No date: Substance abuse (HCC)     Comment:  cocaine, 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 No date: UTI (urinary tract infection) during pregnancy   Reproductive/Obstetrics (+) Pregnancy                              Anesthesia Physical Anesthesia Plan  ASA: 2  Anesthesia Plan: Spinal   Post-op Pain Management:    Induction:   PONV Risk Score and Plan: 2 and Treatment may vary due to age or medical condition  Airway Management Planned: Natural Airway and Nasal Cannula  Additional Equipment:    Intra-op Plan:   Post-operative Plan:   Informed Consent: I have reviewed the patients History and Physical, chart, labs and discussed the procedure including the risks, benefits and alternatives for the proposed anesthesia with the patient or authorized representative who has indicated his/her understanding and acceptance.       Plan Discussed with:   Anesthesia Plan Comments:          Anesthesia Quick Evaluation

## 2024-04-01 NOTE — H&P (Signed)
 Obstetric Preoperative History and Physical  Tamara [name] is a 28 y.o. G2P1001 with IUP at [redacted]w[redacted]d presenting for scheduled cesarean section.  Reports good fetal movement, no bleeding, no contractions, no leaking of fluid.  No acute preoperative concerns.    Cesarean Section Indication: elective repeat  Prenatal Course Source of Care: [facility]  with onset of care at 13 weeks Pregnancy complications or risks: Patient Active Problem List   Diagnosis Date Noted   [redacted] weeks gestation of pregnancy 03/18/2024   Anemia affecting pregnancy 02/15/2024   Pregnancy with history of cesarean section, antepartum 10/02/2023   Supervision of low-risk pregnancy 09/04/2023   Personal history of previous postdates pregnancy 07/02/2017   She is unsure for postpartum contraception.   Prenatal labs and studies: ABO, Rh: --/--/A NEG Performed at [facility], [address] [place], [place] [address]  [phone]) Antibody: NEG (10/10 0906) Rubella: 2.54 (04/14 1101) RPR: NON REACTIVE (10/10 0906)  HBsAg: Negative (04/14 1101)  HIV: NON REACTIVE (10/10 0906)  HAD:Wzhjupcz/-- (09/25 1630) 1 hr Glucola  143; 3hGTT 89/196/111/97 (PASS) Genetic screening normal Anatomy US  normal  Prenatal Transfer Tool  Maternal Diabetes: No Genetic Screening: Normal Maternal Ultrasounds/Referrals: N/A Fetal Ultrasounds or other Referrals:  None Maternal Substance Abuse:  No Significant Maternal Medications:  None Significant Maternal Lab Results: None  Past Medical History:  Diagnosis Date   Anemia    Constipation during pregnancy    Late prenatal care    at 24 wks   Marijuana use    2 wks ago from today 07/01/17   Medical history non-contributory    No prenatal care in current pregnancy in third trimester 06/28/2017   Noncompliance    Substance abuse (HCC)    cocaine, 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   UTI (urinary tract infection) during pregnancy     Past Surgical History:  Procedure Laterality Date    CESAREAN SECTION N/A 07/02/2017   Procedure: CESAREAN SECTION;  Surgeon: Schermerhorn, Debby PARAS, MD;  Location: [facility] ORS;  Service: Obstetrics;  Laterality: N/A;    OB History  Gravida Para Term Preterm AB Living  2 1 1   0 1  SAB IAB Ectopic Multiple Live Births  0    1    # Outcome Date GA Lbr Len/2nd Weight Sex Type Anes PTL Lv  2 Current           1 Term 07/02/17    M CS-Unspec   LIV    Obstetric Comments  Pt states gave child up for adoption    Social History   Socioeconomic History   Marital status: Significant Other    Spouse name: Not on file   Number of children: 1   Years of education: 11   Highest education level: Not on file  Occupational History   Not on file  Tobacco Use   Smoking status: Every Day    Current packs/day: 0.75    Average packs/day: 0.8 packs/day for 15.8 years (13.1 ttl pk-yrs)    Types: Cigarettes    Start date: 2015   Smokeless tobacco: Never  Vaping Use   Vaping status: Never Used  Substance and Sexual Activity   Alcohol use: Not Currently    Comment: socially; last ETOH Nov 2024   Drug use: Not Currently    Types: Marijuana, Cocaine    Comment: last MJ use mid Feb 2025; states hx cocaine 2019 pregnancy and denies since then   Sexual activity: Yes    Birth control/protection: None  Other Topics Concern   Not on file  Social History Narrative   Not on file   Social Drivers of Health   Financial Resource Strain: Not on file  Food Insecurity: No Food Insecurity (04/01/2024)   Hunger Vital Sign    Worried About Running Out of Food in the Last Year: Never true    Ran Out of Food in the Last Year: Never true  Transportation Needs: No Transportation Needs (04/01/2024)   PRAPARE - Administrator, Civil Service (Medical): No    Lack of Transportation (Non-Medical): No  Physical Activity: Not on file  Stress: Not on file  Social Connections: Not on file    History reviewed. No pertinent family history.  Medications  Prior to Admission  Medication Sig Dispense Refill Last Dose/Taking   Prenatal Vit-Fe Fumarate-FA (PRENATAL MULTIVITAMIN) TABS tablet Take 1 tablet by mouth daily at 12 noon. (Patient not taking: Reported on 04/01/2024)   Not Taking    No Known Allergies  Review of Systems: Pertinent items noted in HPI and remainder of comprehensive ROS otherwise negative.  Physical Exam: BP 134/84   Pulse 84   Ht 4' 11 (1.499 m)   Wt 77.1 kg   LMP 07/03/2023 (Exact Date)   BMI 34.34 kg/m  CONSTITUTIONAL: Well-developed, well-nourished female in no acute distress.  HENT:  Normocephalic, atraumatic, External right and left ear normal. Oropharynx is clear and moist EYES: Conjunctivae and EOM are normal. Pupils are equal, round, and reactive to light. No scleral icterus.  NECK: Normal range of motion, supple, no masses SKIN: Skin is warm and dry. No rash noted. Not diaphoretic. No erythema. No pallor. NEUROLOGIC: Alert and oriented to person, place, and time. Normal reflexes, muscle tone coordination. No cranial nerve deficit noted. PSYCHIATRIC: Normal mood and affect. Normal behavior. Normal judgment and thought content. CARDIOVASCULAR: Normal heart rate noted, regular rhythm RESPIRATORY: Effort and breath sounds normal, no problems with respiration noted ABDOMEN: Soft, nontender, nondistended, gravid. Well-healed Pfannenstiel incision. PELVIC: Deferred MUSCULOSKELETAL: Normal range of motion. No edema and no tenderness. 2+ distal pulses.  Pertinent Labs/Studies:   Results for orders placed or performed during the hospital encounter of 04/01/24 (from the past 72 hours)  ABO/Rh     Status: None   Collection Time: 04/01/24  8:14 AM  Result Value Ref Range   ABO/RH(D)      A NEG Performed at [facility], [address] [place], [place] [address]   CBC     Status: Abnormal   Collection Time: 04/01/24  8:14 AM  Result Value Ref Range   WBC 8.9 4.0 - 10.5 K/uL   RBC 4.31 3.87 - 5.11  MIL/uL   Hemoglobin 11.1 (L) 12.0 - 15.0 g/dL   HCT 65.1 (L) 63.9 - 53.9 %   MCV 80.7 80.0 - 100.0 fL   MCH 25.8 (L) 26.0 - 34.0 pg   MCHC 31.9 30.0 - 36.0 g/dL   RDW 84.7 88.4 - 84.4 %   Platelets 338 150 - 400 K/uL   nRBC 0.0 0.0 - 0.2 %    Comment: Performed at [facility], [address], [place], [place] [address]    Assessment and Plan: DHRUTI GHUMAN is a 28 y.o. G2P1001 at [redacted]w[redacted]d being admitted for scheduled cesarean section. The risks of surgery were discussed with the patient including but were not limited to: bleeding which may require transfusion or reoperation; infection which may require antibiotics; injury to bowel, bladder, ureters or  other surrounding organs; injury to the fetus; need for additional procedures including hysterectomy in the event of a life-threatening hemorrhage; formation of adhesions; placental abnormalities wth subsequent pregnancies; incisional problems; thromboembolic phenomenon and other postoperative/anesthesia complications. The patient concurred with the proposed plan, giving informed written consent for the procedure. Patient has been NPO since last night she will remain NPO for procedure. Anesthesia and OR aware. Preoperative prophylactic antibiotics and SCDs ordered on call to the OR. To OR when ready.    Estil Mangle, DO [facility]

## 2024-04-01 NOTE — Transfer of Care (Signed)
 Immediate Anesthesia Transfer of Care Note  Patient: Tamara [name]  Procedure(s) Performed: CESAREAN DELIVERY  Patient Location: PACU  Anesthesia Type:Spinal  Level of Consciousness: awake, alert , and oriented  Airway & Oxygen Therapy: Patient Spontanous Breathing  Post-op Assessment: Report given to RN and Post -op Vital signs reviewed and stable  Post vital signs: Reviewed and stable  Last Vitals:  Vitals Value Taken Time  BP    Temp    Pulse    Resp    SpO2      Last Pain:  Vitals:   04/01/24 0845  TempSrc:   PainSc: 0-No pain         Complications: No notable events documented.

## 2024-04-01 NOTE — Anesthesia Procedure Notes (Addendum)
 Date/Time: 04/01/2024 10:39 AM  Performed by: Duwayne Craven, CRNAPre-anesthesia Checklist: Patient identified, Emergency Drugs available, Suction available, Patient being monitored and Timeout performed Patient Re-evaluated:Patient Re-evaluated prior to induction Oxygen Delivery Method: Nasal cannula Placement Confirmation: CO2 detector and positive ETCO2

## 2024-04-01 NOTE — Discharge Summary (Signed)
 Postpartum Discharge Summary  Date of Service updated***     Patient Name: Tamara [name] DOB: 12-28-1995 MRN: [id number]  Date of admission: 04/01/2024 Delivery date:04/01/2024 Delivering provider: LEIGH SOBER Date of discharge: 04/01/2024  Admitting diagnosis: Pregnancy with history of cesarean section, antepartum [O34.219] Pregnancy [Z34.90] Intrauterine pregnancy: [redacted]w[redacted]d     Secondary diagnosis:  Principal Problem:   Pregnancy  Additional problems: ***    Discharge diagnosis: Term Pregnancy Delivered                                              Post partum procedures:{Postpartum procedures:23558} Augmentation: N/A Complications: None  Hospital course: Sceduled C/S   Tamara [name] at [redacted]w[redacted]d was admitted to the hospital 04/01/2024 for scheduled cesarean section with the following indication:Elective Repeat.Delivery details are as follows:  Membrane Rupture Time/Date: 10:58 AM,04/01/2024  Delivery Method:C-Section, Low Transverse Operative Delivery:N/A Details of operation can be found in separate operative note.  Patient had a postpartum course complicated by***.  She is ambulating, tolerating a regular diet, passing flatus, and urinating well. Patient is discharged home in stable condition on  04/01/24        Newborn Data: Birth date:04/01/2024 Birth time:10:58 AM Gender:Female Living status:Living Apgars:9 ,9  Weight:2890 g    Magnesium Sulfate received: No BMZ received: No Rhophylac :{Rhophylac  received:30440032} MMR:N/A T-DaP:Given prenatally Flu: declined RSV Vaccine received: No Transfusion:{Transfusion received:30440034} Immunizations administered: Immunization History  Administered Date(s) Administered   Tdap 01/17/2024    Recommend 6 weeks of prophylactic anticoagulation with LMWH or subcutaneous unfractionated heparin if 1 or more high risk factor is present.  Recommend 14 days of prophylactic anticoagulation with LMWH or subcutaneous  unfractionated heparin if 3 or more moderate risk factors are present.   Risk assessment for postpartum VTE and prophylactic treatment:  High risk factors: None Moderate risk factors: Cesarean delivery  and APLS without history of VTE  Postpartum VTE prophylaxis with LMWH not indicated   Physical exam  Vitals:   04/01/24 1218 04/01/24 1219 04/01/24 1220 04/01/24 1221  BP:      Pulse: 80 77 77 86  Resp: 16 15 16 16   Temp:      TempSrc:      SpO2: 98% 98% 97% 98%  Weight:      Height:       General: {Exam; general:21111117} Lochia: {Desc; appropriate/inappropriate:30686::appropriate} Uterine Fundus: {Desc; firm/soft:30687} Incision: {Exam; incision:21111123} DVT Evaluation: {Exam; dvt:2111122} Labs: Lab Results  Component Value Date   WBC 8.9 04/01/2024   HGB 11.1 (L) 04/01/2024   HCT 34.8 (L) 04/01/2024   MCV 80.7 04/01/2024   PLT 338 04/01/2024      Latest Ref Rng & Units 07/13/2022    4:15 AM  CMP  Glucose 70 - 99 mg/dL 95   BUN 6 - 20 mg/dL 14   Creatinine 9.55 - 1.00 mg/dL 9.38   Sodium 864 - 854 mmol/L 139   Potassium 3.5 - 5.1 mmol/L 3.6   Chloride 98 - 111 mmol/L 104   CO2 22 - 32 mmol/L 27   Calcium  8.9 - 10.3 mg/dL 9.6    Edinburgh Score:     No data to display             After visit meds:  Allergies as of 04/01/2024   No Known Allergies   Med Rec must be completed  prior to using this [facility]***        Discharge home in stable condition Infant Feeding: Bottle Infant Disposition:{CHL IP OB HOME WITH FNUYZM:76418} Discharge instruction: per After Visit Summary and Postpartum booklet. Activity: Advance as tolerated. Pelvic rest for 6 weeks.  Diet: {OB diet:21111121} Anticipated Birth Control: none Postpartum VTE prophyhlaxis: Not Indicated Postpartum Appointment:6 weeks Additional Postpartum F/U: Postpartum Depression checkup and Incision check 1 week Future Appointments: Future Appointments  Date Time Provider Department  Center  04/10/2024  1:55 PM Leigh Sober, MD [facility] None   Follow up Visit:      04/01/2024 Harlene LITTIE Cisco, CNM

## 2024-04-01 NOTE — Op Note (Signed)
 CESAREAN SECTION OPERATIVE REPORT   DATE OF SURGERY: 04/01/24  SURGEON: Estil Mangle, DO ASSISTANT:  Jessica Albrecht,CNM ANESTHESIA: Spinal  PROCEDURE: Repeat low transverse cesarean section  PREOPERATIVE DIAGNOSES: 1. Intrauterine pregnancy at [redacted]w[redacted]d 2. History of previous cesarean section x 1 3. Declines TOLAC  POSTOPERATIVE DIAGNOSES: 1. Same, s/p rLTCS 2. Perintoneal adhesions to uterus  QBL:  385cc DRAINS: foley catheter to gravity drainage, 100 ml of clear urine at end of the procedure TOTAL IV FLUIDS: Total I/O In: -  Out: 485 [Urine:100; Blood:385] ml SPECIMENS: None COMPLICATIONS:  None  FINDINGS:  Viable female infant in cephalic presentation; APGARs 9/9; weight 2890 grams (6lbs, 6oz) Clear fluid at amniotomy Intact placenta with 3 vessel cord Uterus, tubes, and ovaries appeared normal  INDICATION and CONSENT: Tamara [name] is a 28 y.o. [id number] with IUP at [redacted]w[redacted]d presenting for elective repeat cesarean. The patient understood that the risks of cesarean section include, but are not limited to, visceral or vascular injury, infection, blood loss and need for transfusion, prolonged hospitalization, and reoperation.  The patient stated understanding and desired to proceed.  All questions were answered.  PROCEDURE:  After verbal and written informed consent was obtained, the patient was taken to the operating room where spinal anesthesia was found to be adequate.  SCDs were applied to the lower extremities and a Foley catheter was placed in the bladder under sterile technique.  The patient was placed in dorsal supine position with a leftward tilt, prepped and draped in a sterile fashion.  Two grams of Cefazolin  were given for infection prophylaxis.  Level of anesthesia was confirmed to be adequate with Allis clamps.  A Pfannenstiel skin incision was made with the scalpel and carried down to the underlying layer of rectus fascia.  The fascia was nicked bilaterally in the  midline with the scalpel and the fascial incision was extended laterally using Mayo scissors.  The superior aspect of the fascia was grasped with Kocher clamps and the underlying rectus muscles were dissected off sharply with Mayo scissors and bluntly.  In a similar fashion, the inferior aspect of fascia was grasped with Kocher clamps and the underlying rectus and pyramidalis were dissected off sharply and bluntly.  The rectus muscles were separated in the midline bluntly.  The peritoneum was found to be free of adherent bowel and entered bluntly.  The peritoneal incision was extended bluntly to the bladder reflection with good visualization of the bladder. Large peritoneal adhesions to the uterine body were noted. They were doubly clamped and separated with the Bovie, hemostatic.   The Alexis retractor was inserted and vesicouterine peritoneum identified.  Intraabdomnial survey revealed scant, clear peritoneal fluid and a thinned-out lower uterine segment.  The lower uterine segment was incised transversely with the scalpel.  The amniotic sac was ruptured with an Allis clamp and clear fluid noted.  The uterine incision was extended bluntly in a cranial-caudal fashion.  The fetus was in cephalic presentation.  The head was flexed and elevated to the level of the uterine incision.  Gentle fundal pressure was applied by the assistant and the infant was delivered without difficulty.  The nose and mouth were suctioned with a bulb. Delayed cord clamping was performed for approximately 60 seconds. The infant was shown to the patient. The cord was doubly clamped and cut.  The infant was handed to the awaiting NICU team.  The placenta delivered intact & spontaneously with manual massage of the uterine fundus. The uterus was left in  situ.  The inside of the uterus was gently wiped with lap sponges x 2 ensuring complete removal of placental membranes. The uterine incision was repaired with a double layer closure of  0-Vicryl first in a locking fashion, followed by 0-Vicryl in an imbricating stitch, with excellent hemostasis achieved.  The ovaries and tubes were found to be grossly normal.  Blood clots, debris and fluid were cleaned from the abdomen, gutters, and pelvis with moist laparotomy sponges.  The uterine incision was reinspected and was hemostatic.   The superior and inferior fascia were grasped with Kocher clamps and the rectus muscles were examined and found to be hemostatic, ensured with Bovie electrocautery.  The peritoneum was closed with 3-0 Monocryl in a continuous, running fashion. The fascial layer was closed with 0-Vicryl in a running fashion.  The subcutaneous tissue was irrigated, made hemostatic with Bovie electrocautery, then reapproximated with a running layer of 3-0 Plain. The skin was closed subcuticularly with 4-0 Vicryl on a keith and steristrips and a sterile pressure dressing was placed.  All sponge, lap and instrument counts were correct x 2. The patient tolerated the procedure well and was taken to the recovery room in stable condition.  An experienced assistant was required given the standard of surgical care given the complexity of the case.  This assistant was needed for exposure, dissection, suctioning, retraction, instrument exchange, and for overall help during the procedure.   Estil Mangle, DO [facility]

## 2024-04-01 NOTE — Anesthesia Procedure Notes (Addendum)
 Spinal  Patient location during procedure: OR Start time: 04/01/2024 10:15 AM End time: 04/01/2024 10:27 AM Reason for block: surgical anesthesia Staffing Performed: resident/CRNA and anesthesiologist  Anesthesiologist: Dario Barter, MD Resident/CRNA: Duwayne Craven, CRNA Performed by: Duwayne Craven, CRNA Authorized by: Dario Barter, MD   Preanesthetic Checklist Completed: patient identified, IV checked, site marked, risks and benefits discussed, surgical consent, monitors and equipment checked, pre-op evaluation and timeout performed Spinal Block Patient position: sitting Prep: ChloraPrep Patient monitoring: heart rate, cardiac monitor, continuous pulse ox and blood pressure Approach: midline Location: L3-4 Injection technique: single-shot Needle Needle type: Sprotte  Needle gauge: 24 G Needle length: 9 cm Assessment Sensory level: T4 Events: CSF return Additional Notes IV functioning, monitors applied to pt. Expiration date of kit checked and confirmed to be in date. Sterile prep and drape, hand hygiene and sterile gloved used. Pt was positioned and spine was prepped in sterile fashion. Skin was anesthetized with lidocaine . Free flow of clear CSF obtained prior to injecting local anesthetic into CSF x 1 attempt. Spinal needle aspirated freely following injection. Needle was carefully withdrawn, and pt tolerated procedure well. Loss of motor and sensory on exam post injection.

## 2024-04-02 ENCOUNTER — Encounter: Payer: Self-pay | Admitting: Obstetrics

## 2024-04-02 LAB — BPAM RBC
Blood Product Expiration Date: 202510262359
Blood Product Expiration Date: 202510292359
Unit Type and Rh: 600
Unit Type and Rh: 600

## 2024-04-02 LAB — TYPE AND SCREEN
ABO/RH(D): A NEG
Antibody Screen: NEGATIVE
Extend sample reason: UNDETERMINED
Unit division: 0
Unit division: 0

## 2024-04-02 LAB — CBC
HCT: 27.2 % — ABNORMAL LOW (ref 36.0–46.0)
Hemoglobin: 8.6 g/dL — ABNORMAL LOW (ref 12.0–15.0)
MCH: 26.1 pg (ref 26.0–34.0)
MCHC: 31.6 g/dL (ref 30.0–36.0)
MCV: 82.4 fL (ref 80.0–100.0)
Platelets: 232 K/uL (ref 150–400)
RBC: 3.3 MIL/uL — ABNORMAL LOW (ref 3.87–5.11)
RDW: 15.3 % (ref 11.5–15.5)
WBC: 9.2 K/uL (ref 4.0–10.5)
nRBC: 0 % (ref 0.0–0.2)

## 2024-04-02 LAB — FETAL SCREEN: Fetal Screen: NEGATIVE

## 2024-04-02 MED ORDER — IRON SUCROSE 500 MG IVPB - SIMPLE MED
500.0000 mg | Freq: Once | INTRAVENOUS | Status: AC
  Administered 2024-04-02: 500 mg via INTRAVENOUS
  Filled 2024-04-02: qty 500

## 2024-04-02 MED ORDER — RHO D IMMUNE GLOBULIN 1500 UNIT/2ML IJ SOSY
300.0000 ug | PREFILLED_SYRINGE | Freq: Once | INTRAMUSCULAR | Status: AC
  Administered 2024-04-02: 300 ug via INTRAVENOUS
  Filled 2024-04-02: qty 2

## 2024-04-02 MED ORDER — IBUPROFEN 100 MG/5ML PO SUSP
800.0000 mg | Freq: Three times a day (TID) | ORAL | Status: DC
  Administered 2024-04-03 (×2): 800 mg via ORAL
  Filled 2024-04-02 (×3): qty 40

## 2024-04-02 NOTE — Anesthesia Postprocedure Evaluation (Signed)
 Anesthesia Post Note  Patient: Tamara [name]  Procedure(s) Performed: CESAREAN DELIVERY  Patient location during evaluation: Mother Baby Anesthesia Type: Spinal Level of consciousness: oriented and awake and alert Pain management: pain level controlled Vital Signs Assessment: post-procedure vital signs reviewed and stable Respiratory status: spontaneous breathing and respiratory function stable Cardiovascular status: blood pressure returned to baseline and stable Postop Assessment: no headache, no backache, no apparent nausea or vomiting and able to ambulate Anesthetic complications: no   No notable events documented.   Last Vitals:  Vitals:   04/02/24 0400 04/02/24 0500  BP:    Pulse: 74 71  Resp:    Temp:    SpO2: 96% 93%    Last Pain:  Vitals:   04/02/24 0245  TempSrc: Oral  PainSc: 0-No pain                 Reon Hunley B Lacretia

## 2024-04-02 NOTE — Anesthesia Postprocedure Evaluation (Signed)
 Anesthesia Post Note  Patient: Tamara [name]  Procedure(s) Performed: CESAREAN DELIVERY  Anesthesia Type: Spinal Anesthetic complications: no   No notable events documented.   Last Vitals:  Vitals:   04/02/24 0500 04/02/24 0805  BP:  98/67  Pulse: 71 71  Resp:  20  Temp:  37.1 C  SpO2: 93% 97%    Last Pain:  Vitals:   04/02/24 0805  TempSrc: Oral  PainSc:                  [name]

## 2024-04-02 NOTE — Progress Notes (Signed)
 Progress Note - Cesarean Delivery  Tamara [name] is a 28 y.o. G2P2002 now PP day 1 s/p C-Section, Low Transverse.   Subjective:  Patient reports no problems with eating,She has not yet voided catheter was removed this morning. She state she has not passed gas yet but feels like it is coming soon. She denies any problems with her wound    Objective:  Vital signs in last 24 hours: Temp:  [98.3 F (36.8 C)-98.9 F (37.2 C)] 98.7 F (37.1 C) (10/14 0805) Pulse Rate:  [64-102] 71 (10/14 0805) Resp:  [0-34] 20 (10/14 0805) BP: (98-129)/(53-81) 98/67 (10/14 0805) SpO2:  [93 %-100 %] 97 % (10/14 0805)  Physical Exam:  General: cooperative, appears stated age, fatigued, and no distress Lochia: appropriate Uterine Fundus: firm Incision: pressure dressing , clean , dry and in tact.     Data Review Recent Labs    04/01/24 0814 04/02/24 0528  HGB 11.1* 8.6*  HCT 34.8* 27.2*    Assessment:  Principal Problem:   Pregnancy Active Problems:   Encounter for maternal care for low transverse scar from repeat cesarean delivery   Cesarean delivery delivered   Single live birth   Status post Cesarean section. Doing well postoperatively.   Iron infusion ordered for acute blood loss anemia postpartum   Plan:       Continue current care.  Plan for discharge tomorrow   Zelda Hummer, CNM  04/02/2024 9:00 AM

## 2024-04-03 ENCOUNTER — Other Ambulatory Visit: Payer: Self-pay

## 2024-04-03 LAB — RHOGAM INJECTION: Unit division: 0

## 2024-04-03 MED ORDER — IBUPROFEN 100 MG/5ML PO SUSP
800.0000 mg | Freq: Three times a day (TID) | ORAL | 1 refills | Status: AC
  Filled 2024-04-03: qty 273, 2d supply, fill #0

## 2024-04-03 MED ORDER — OXYCODONE HCL 5 MG/5ML PO SOLN
5.0000 mg | ORAL | 0 refills | Status: AC | PRN
  Filled 2024-04-03: qty 100, 4d supply, fill #0

## 2024-04-03 NOTE — Patient Instructions (Signed)
 Postpartum Care After Cesarean Delivery The following information offers guidance on how to care for yourself from the time you deliver your baby to 6-12 weeks after delivery (postpartum period). Your health care provider may also give you more specific instructions. If you have problems or questions, contact your health care provider. How to care for yourself Perineal care     If your C-section (Cesarean section) was unplanned, and you were allowed to labor and push before delivery, you may have pain, swelling, and discomfort in the tissue between your vaginal opening and your anus (perineum). You may also have an incision in the tissue (episiotomy) or the tissue may have torn during delivery. Follow these instructions as told by your health care provider: Keep your perineum clean and dry. Use medicated pads and pain-relieving sprays and creams as directed. If you have an episiotomy or vaginal tear, check the area every day for signs of infection. Check for: Redness, swelling, or pain. Fluid or blood. Warmth. Pus or a bad smell. You may use a squirt bottle instead of wiping to clean the perineum area after you go to the bathroom. As you start healing, use the squirt bottle before wiping yourself. Make sure to wipe gently. To relieve pain caused by an episiotomy, vaginal tear, or hemorrhoids, try taking a warm sitz bath 2-3 times a day. Use a portable sitz bath that you can put over the toilet. Make sure the water covers your buttocks and perineum when you sit on the seat. Vaginal bleeding It is normal to have vaginal bleeding (lochia) after delivery. Wear a sanitary pad to absorb vaginal bleeding and discharge. During the first week after delivery, the amount and appearance of lochia is often similar to a menstrual period. Over the next few weeks, it will slowly decrease to a dry, yellow-brown discharge. For most women, lochia stops completely by 4-6 weeks after delivery. Vaginal bleeding can  vary from woman to woman. Change your sanitary pads frequently. Watch for any changes in your flow, such as: An increase in bleeding. A change in color. Large blood clots. If you pass a blood clot the size of an egg or larger, contact your health care provider. Do not use tampons or douches until your health care provider says it is safe. If you are not breastfeeding, your period should return 6-8 weeks after delivery. If you are breastfeeding, the time when your period returns varies based on whether or not you are breastfeeding exclusively. Breast care Within the first few days after delivery, your breasts may feel heavy, full, and uncomfortable (breast engorgement). You may also have milk leaking from your breasts. Your health care provider can suggest ways to help relieve breast discomfort. Breast engorgement should go away within a few days. If you are breastfeeding: Wear a bra that supports your breasts and fits you well. Keep your nipples clean and dry. Apply creams and ointments as told. You may need to use breast pads to absorb milk leakage. You may have uterine contractions every time you breastfeed for several weeks after delivery. Uterine contractions help your uterus return to its normal size. If you have any problems with breastfeeding, work with your health care provider or a Advertising copywriter. Take over-the-counter medicines as told by your health care provider to help with pain or discomfort. If you are not breastfeeding: Wear a well-fitting bra and use cold packs to help with swelling. Do not squeeze out (express) milk. This causes you to make more milk. Intimacy and  sexuality Ask your health care provider when you can engage in sexual activity. You are able to get pregnant after delivery, even if you have not had your period. If desired, talk with your health care provider about methods of family planning or birth control (contraception). Follow these instructions at  home: Medicines Take over-the-counter and prescription medicines only as told by your health care provider. If you were prescribed an antibiotic medicine, take it as told by your health care provider. Do not stop taking the antibiotic even if you start to feel better. Take your prenatal vitamins until your postpartum checkup or until your health care provider tells you it is okay to stop taking them. Activity Return to your normal activities as told by your health care provider. Ask your health care provider what activities are safe for you. You may have to avoid lifting. Ask your health care provider how much you can safely lift. If possible, have someone help you at home until you are able to do your usual activities yourself. Try to rest or take naps while your baby is sleeping. General instructions Drink enough fluid to keep your urine pale yellow. Do not drink alcohol, especially if you are breastfeeding. Do not use any products that contain nicotine or tobacco. These products include cigarettes, chewing tobacco, and vaping devices, such as e-cigarettes. If you need help quitting, ask your health care provider. Keep all follow-up visits. Your health care provider will check your healing after delivery and also check your blood pressure. Contact a health care provider if: You have: A fever. Breasts that are painful, hard, or turn red. Stopped breastfeeding and you have not had a menstrual period for 12 weeks after you stopped breastfeeding. Not breastfed at all and you have not had a menstrual period for 12 weeks after delivery. Trouble holding urine or keeping urine from leaking. A bad-smelling vaginal discharge. You have bleeding that soaks through one pad an hour or you have blood clots the size of an egg or larger. You have questions about caring for yourself or your baby. You feel unable to cope with the changes that a new baby brings to your life, and these feelings do not go away.  These include: Feeling unusually sad or worried. Having little or no interest in activities you used to enjoy. Get help right away if: You have: Chest pain or difficulty breathing. Pain, redness or swelling in an arm or leg. Severe pain or cramping in your abdomen. Thoughts about hurting yourself or your baby. You faint or have a seizure. You have any of the following symptoms and you were unable to reach your health care provider: A fever or other signs of infection. Bleeding that is soaking through one pad an hour or you have blood clots the size of an egg or larger. A severe headache that does not go away or you have a headache with vision changes. These symptoms may be an emergency. Get help right away. Call 911. Do not wait to see if the symptoms will go away. Do not drive yourself to the hospital. Get help right away if you feel like you may hurt yourself or others, or have thoughts about taking your own life. Go to your nearest emergency room or: Call 911. Call the National Suicide Prevention Lifeline at 438-797-6303 or 988. This is open 24 hours a day. Text the Crisis Text Line at 878 625 7513. This information is not intended to replace advice given to you by your health care  provider. Make sure you discuss any questions you have with your health care provider. Document Revised: 03/17/2022 Document Reviewed: 03/17/2021 Elsevier Patient Education  2024 [employer].

## 2024-04-03 NOTE — Progress Notes (Signed)
   Postoperative Cesarean Incision Check Tamara [name] is a 28 y.o. [id number] s/p repeat LTCS at [redacted]w[redacted]d for elective repreat, POD#9, here today for incision check.  Subjective: Pain is controlled with current analgesics. Medications being used: acetaminophen . She denies fever, chills, nausea, and vomiting. Eating a regular diet without difficulty.  Is having regular bowel movements. Activity: normal activities of daily living. Bleeding is light. She denies issues with her incision.    Objective: BP 135/77   Pulse 76   Wt 153 lb 14.4 oz (69.8 kg)   LMP 07/03/2023 (Exact Date)   Breastfeeding No   BMI 31.08 kg/m  Body mass index is 31.08 kg/m.  General:  alert and no distress  Abdomen: soft, bowel sounds active, non-tender  Incision:   healing well, no drainage, no erythema, no hernia, no seroma, no swelling, no dehiscence, incision well approximated    Assessment/Plan: Tamara [name] is a 28 y.o. [id number] s/p repeat LTCS  at [redacted]w[redacted]d for elective repeat, POD#9, here today for incision check, healing well. No concerns with incision today, remaining steri-strips removed and replaced.  -Discussed at-home care, healing expectations, si/sx of infection.  -Call clinic if developing redness, discharge, or increasing pain. -Avoid vigorous scrubbing/washing of incision site; hygiene reviewed. -May trim back steri-strips if they peel; should fully remove after 1 week from today. -May resume driving and light walking. Still no heavy lifting >10-12 lbs and pelvic rest advised until cleared at 6wk postpartum visit.  -If stooling regularly x 1-2 weeks, can take stool softener every other day x 1 week, taper as tolerated.  Return in about 5 weeks (around 05/15/2024) for 6 week postpartum.   Estil Mangle, DO [facility]

## 2024-04-10 ENCOUNTER — Ambulatory Visit (INDEPENDENT_AMBULATORY_CARE_PROVIDER_SITE_OTHER): Admitting: Obstetrics

## 2024-04-10 ENCOUNTER — Encounter: Admitting: Obstetrics

## 2024-04-10 ENCOUNTER — Encounter: Payer: Self-pay | Admitting: Obstetrics

## 2024-04-10 VITALS — BP 135/77 | HR 76 | Wt 153.9 lb

## 2024-04-10 DIAGNOSIS — Z4889 Encounter for other specified surgical aftercare: Secondary | ICD-10-CM

## 2024-04-10 DIAGNOSIS — R600 Localized edema: Secondary | ICD-10-CM

## 2024-04-10 MED ORDER — FUROSEMIDE 20 MG PO TABS: 20.0000 mg | ORAL_TABLET | Freq: Every day | ORAL | 0 refills | Status: AC

## 2024-05-10 NOTE — Progress Notes (Deleted)
   OBSTETRICS POSTPARTUM CLINIC PROGRESS NOTE  Subjective:     TORIANNE LAFLAM is a 28 y.o. [id number] female who presents for a postpartum visit. She is 7 weeks postpartum following a low transverse Cesarean section. I have reviewed the prenatal and intrapartum course. The delivery was at 39 gestational weeks.  Anesthesia: spinal. Postpartum course has been ***. Baby's course has been ***. Baby is feeding by {breast/bottle:69}. Bleeding: patient {HAS HAS WNU:81165} not resumed menses, with Patient's last menstrual period was 07/03/2023 (exact date).. Bowel function is {normal:32111}. Bladder function is {normal:32111}. Patient {is/is not:9024} sexually active. Contraception method desired is {contraceptive method:5051}. Postpartum depression screening: {neg default:13464::negative}.  EDPS score is ***.    The following portions of the patient's history were reviewed and updated as appropriate: allergies, current medications, past family history, past medical history, past social history, past surgical history, and problem list.  Review of Systems {ros; complete:30496}   Objective:    LMP 07/03/2023 (Exact Date)   General:  alert and no distress   Breasts:  inspection negative, no nipple discharge or bleeding, no masses or nodularity palpable  Lungs: clear to auscultation bilaterally  Heart:  regular rate and rhythm, S1, S2 normal, no murmur, click, rub or gallop  Abdomen: soft, non-tender; bowel sounds normal; no masses,  no organomegaly.  ***Well healed Pfannenstiel incision   Vulva:  normal  Vagina: normal vagina, no discharge, exudate, lesion, or erythema  Cervix:  no cervical motion tenderness and no lesions  Corpus: normal size, contour, position, consistency, mobility, non-tender  Adnexa:  normal adnexa and no mass, fullness, tenderness  Rectal Exam: Not performed.         Labs:  Lab Results  Component Value Date   HGB 8.6 (L) 04/02/2024     Assessment:   1. Postpartum  care and examination   2. Cervical cancer screening   3. Anemia of mother in pregnancy, delivered with postpartum condition      Plan:    1. Contraception: {method:5051} 2. Will check Hgb for h/o postpartum anemia of less than 10.  3. Follow up in: {1-10:13787} {time; units:19136} or as needed.    Vicci Rollo BRAVO, RN [facility]

## 2024-05-21 ENCOUNTER — Ambulatory Visit: Admitting: Obstetrics

## 2024-05-21 DIAGNOSIS — Z124 Encounter for screening for malignant neoplasm of cervix: Secondary | ICD-10-CM

## 2024-05-21 DIAGNOSIS — O9903 Anemia complicating the puerperium: Secondary | ICD-10-CM
